# Patient Record
Sex: Female | Born: 1955 | Race: White | Hispanic: No | Marital: Married | Smoking: Never smoker
Health system: Southern US, Community
[De-identification: ages and names within clinical notes are randomized; demographics above are authoritative.]

## PROBLEM LIST (undated history)

## (undated) DIAGNOSIS — F32A Depression, unspecified: Secondary | ICD-10-CM

## (undated) DIAGNOSIS — E119 Type 2 diabetes mellitus without complications: Secondary | ICD-10-CM

## (undated) DIAGNOSIS — M1990 Unspecified osteoarthritis, unspecified site: Secondary | ICD-10-CM

## (undated) DIAGNOSIS — F329 Major depressive disorder, single episode, unspecified: Secondary | ICD-10-CM

## (undated) DIAGNOSIS — M797 Fibromyalgia: Secondary | ICD-10-CM

## (undated) HISTORY — PX: BACK SURGERY: SHX140

## (undated) HISTORY — PX: EYE SURGERY: SHX253

## (undated) HISTORY — PX: TUBAL LIGATION: SHX77

---

## 2014-09-14 ENCOUNTER — Emergency Department (HOSPITAL_COMMUNITY): Payer: PRIVATE HEALTH INSURANCE

## 2014-09-14 ENCOUNTER — Emergency Department (HOSPITAL_COMMUNITY)
Admission: EM | Admit: 2014-09-14 | Discharge: 2014-09-14 | Disposition: A | Discharge status: Home / Self Care | Payer: PRIVATE HEALTH INSURANCE | Attending: Emergency Medicine | Admitting: Emergency Medicine

## 2014-09-14 ENCOUNTER — Encounter (HOSPITAL_COMMUNITY): Payer: Self-pay | Admitting: Emergency Medicine

## 2014-09-14 DIAGNOSIS — S3992XA Unspecified injury of lower back, initial encounter: Secondary | ICD-10-CM | POA: Diagnosis present

## 2014-09-14 DIAGNOSIS — M545 Low back pain, unspecified: Secondary | ICD-10-CM

## 2014-09-14 DIAGNOSIS — Y93E5 Activity, floor mopping and cleaning: Secondary | ICD-10-CM | POA: Insufficient documentation

## 2014-09-14 DIAGNOSIS — Y92019 Unspecified place in single-family (private) house as the place of occurrence of the external cause: Secondary | ICD-10-CM | POA: Insufficient documentation

## 2014-09-14 DIAGNOSIS — E119 Type 2 diabetes mellitus without complications: Secondary | ICD-10-CM | POA: Insufficient documentation

## 2014-09-14 DIAGNOSIS — Y998 Other external cause status: Secondary | ICD-10-CM | POA: Diagnosis not present

## 2014-09-14 DIAGNOSIS — M5431 Sciatica, right side: Secondary | ICD-10-CM

## 2014-09-14 DIAGNOSIS — W11XXXA Fall on and from ladder, initial encounter: Secondary | ICD-10-CM | POA: Diagnosis not present

## 2014-09-14 DIAGNOSIS — Z8739 Personal history of other diseases of the musculoskeletal system and connective tissue: Secondary | ICD-10-CM | POA: Insufficient documentation

## 2014-09-14 HISTORY — DX: Fibromyalgia: M79.7

## 2014-09-14 HISTORY — DX: Type 2 diabetes mellitus without complications: E11.9

## 2014-09-14 HISTORY — DX: Unspecified osteoarthritis, unspecified site: M19.90

## 2014-09-14 MED ORDER — IBUPROFEN 800 MG PO TABS: 800.0000 mg | ORAL_TABLET | Freq: Four times a day (QID) | ORAL | Status: DC | PRN

## 2014-09-14 MED ORDER — OXYCODONE-ACETAMINOPHEN 5-325 MG PO TABS
1.0000 | ORAL_TABLET | Freq: Once | ORAL | Status: AC
  Administered 2014-09-14: 1 via ORAL
  Filled 2014-09-14: qty 1

## 2014-09-14 NOTE — Discharge Instructions (Signed)
Sciatica with Rehab The sciatic nerve runs from the back down the leg and is responsible for sensation and control of the muscles in the back (posterior) side of the thigh, lower leg, and foot. Sciatica is a condition that is characterized by inflammation of this nerve.  SYMPTOMS   Signs of nerve damage, including numbness and/or weakness along the posterior side of the lower extremity.  Pain in the back of the thigh that may also travel down the leg.  Pain that worsens when sitting for long periods of time.  Occasionally, pain in the back or buttock. CAUSES  Inflammation of the sciatic nerve is the cause of sciatica. The inflammation is due to something irritating the nerve. Common sources of irritation include:  Sitting for long periods of time.  Direct trauma to the nerve.  Arthritis of the spine.  Herniated or ruptured disk.  Slipping of the vertebrae (spondylolisthesis).  Pressure from soft tissues, such as muscles or ligament-like tissue (fascia). RISK INCREASES WITH:  Sports that place pressure or stress on the spine (football or weightlifting).  Poor strength and flexibility.  Failure to warm up properly before activity.  Family history of low back pain or disk disorders.  Previous back injury or surgery.  Poor body mechanics, especially when lifting, or poor posture. PREVENTION   Warm up and stretch properly before activity.  Maintain physical fitness:  Strength, flexibility, and endurance.  Cardiovascular fitness.  Learn and use proper technique, especially with posture and lifting. When possible, have coach correct improper technique.  Avoid activities that place stress on the spine. PROGNOSIS If treated properly, then sciatica usually resolves within 6 weeks. However, occasionally surgery is necessary.  RELATED COMPLICATIONS   Permanent nerve damage, including pain, numbness, tingle, or weakness.  Chronic back pain.  Risks of surgery: infection,  bleeding, nerve damage, or damage to surrounding tissues. TREATMENT Treatment initially involves resting from any activities that aggravate your symptoms. The use of ice and medication may help reduce pain and inflammation. The use of strengthening and stretching exercises may help reduce pain with activity. These exercises may be performed at home or with referral to a therapist. A therapist may recommend further treatments, such as transcutaneous electronic nerve stimulation (TENS) or ultrasound. Your caregiver may recommend corticosteroid injections to help reduce inflammation of the sciatic nerve. If symptoms persist despite non-surgical (conservative) treatment, then surgery may be recommended. MEDICATION  If pain medication is necessary, then nonsteroidal anti-inflammatory medications, such as aspirin and ibuprofen, or other minor pain relievers, such as acetaminophen, are often recommended.  Do not take pain medication for 7 days before surgery.  Prescription pain relievers may be given if deemed necessary by your caregiver. Use only as directed and only as much as you need.  Ointments applied to the skin may be helpful.  Corticosteroid injections may be given by your caregiver. These injections should be reserved for the most serious cases, because they may only be given a certain number of times. HEAT AND COLD  Cold treatment (icing) relieves pain and reduces inflammation. Cold treatment should be applied for 10 to 15 minutes every 2 to 3 hours for inflammation and pain and immediately after any activity that aggravates your symptoms. Use ice packs or massage the area with a piece of ice (ice massage).  Heat treatment may be used prior to performing the stretching and strengthening activities prescribed by your caregiver, physical therapist, or athletic trainer. Use a heat pack or soak the injury in warm water.   SEEK MEDICAL CARE IF:  Treatment seems to offer no benefit, or the condition  worsens.  Any medications produce adverse side effects. EXERCISES  RANGE OF MOTION (ROM) AND STRETCHING EXERCISES - Sciatica Most people with sciatic will find that their symptoms worsen with either excessive bending forward (flexion) or arching at the low back (extension). The exercises which will help resolve your symptoms will focus on the opposite motion. Your physician, physical therapist or athletic trainer will help you determine which exercises will be most helpful to resolve your low back pain. Do not complete any exercises without first consulting with your clinician. Discontinue any exercises which worsen your symptoms until you speak to your clinician. If you have pain, numbness or tingling which travels down into your buttocks, leg or foot, the goal of the therapy is for these symptoms to move closer to your back and eventually resolve. Occasionally, these leg symptoms will get better, but your low back pain may worsen; this is typically an indication of progress in your rehabilitation. Be certain to be very alert to any changes in your symptoms and the activities in which you participated in the 24 hours prior to the change. Sharing this information with your clinician will allow him/her to most efficiently treat your condition. These exercises may help you when beginning to rehabilitate your injury. Your symptoms may resolve with or without further involvement from your physician, physical therapist or athletic trainer. While completing these exercises, remember:   Restoring tissue flexibility helps normal motion to return to the joints. This allows healthier, less painful movement and activity.  An effective stretch should be held for at least 30 seconds.  A stretch should never be painful. You should only feel a gentle lengthening or release in the stretched tissue. FLEXION RANGE OF MOTION AND STRETCHING EXERCISES: STRETCH - Flexion, Single Knee to Chest   Lie on a firm bed or floor  with both legs extended in front of you.  Keeping one leg in contact with the floor, bring your opposite knee to your chest. Hold your leg in place by either grabbing behind your thigh or at your knee.  Pull until you feel a gentle stretch in your low back. Hold __________ seconds.  Slowly release your grasp and repeat the exercise with the opposite side. Repeat __________ times. Complete this exercise __________ times per day.  STRETCH - Flexion, Double Knee to Chest  Lie on a firm bed or floor with both legs extended in front of you.  Keeping one leg in contact with the floor, bring your opposite knee to your chest.  Tense your stomach muscles to support your back and then lift your other knee to your chest. Hold your legs in place by either grabbing behind your thighs or at your knees.  Pull both knees toward your chest until you feel a gentle stretch in your low back. Hold __________ seconds.  Tense your stomach muscles and slowly return one leg at a time to the floor. Repeat __________ times. Complete this exercise __________ times per day.  STRETCH - Low Trunk Rotation   Lie on a firm bed or floor. Keeping your legs in front of you, bend your knees so they are both pointed toward the ceiling and your feet are flat on the floor.  Extend your arms out to the side. This will stabilize your upper body by keeping your shoulders in contact with the floor.  Gently and slowly drop both knees together to one side until   you feel a gentle stretch in your low back. Hold for __________ seconds.  Tense your stomach muscles to support your low back as you bring your knees back to the starting position. Repeat the exercise to the other side. Repeat __________ times. Complete this exercise __________ times per day  EXTENSION RANGE OF MOTION AND FLEXIBILITY EXERCISES: STRETCH - Extension, Prone on Elbows  Lie on your stomach on the floor, a bed will be too soft. Place your palms about shoulder  width apart and at the height of your head.  Place your elbows under your shoulders. If this is too painful, stack pillows under your chest.  Allow your body to relax so that your hips drop lower and make contact more completely with the floor.  Hold this position for __________ seconds.  Slowly return to lying flat on the floor. Repeat __________ times. Complete this exercise __________ times per day.  RANGE OF MOTION - Extension, Prone Press Ups  Lie on your stomach on the floor, a bed will be too soft. Place your palms about shoulder width apart and at the height of your head.  Keeping your back as relaxed as possible, slowly straighten your elbows while keeping your hips on the floor. You may adjust the placement of your hands to maximize your comfort. As you gain motion, your hands will come more underneath your shoulders.  Hold this position __________ seconds.  Slowly return to lying flat on the floor. Repeat __________ times. Complete this exercise __________ times per day.  STRENGTHENING EXERCISES - Sciatica  These exercises may help you when beginning to rehabilitate your injury. These exercises should be done near your "sweet spot." This is the neutral, low-back arch, somewhere between fully rounded and fully arched, that is your least painful position. When performed in this safe range of motion, these exercises can be used for people who have either a flexion or extension based injury. These exercises may resolve your symptoms with or without further involvement from your physician, physical therapist or athletic trainer. While completing these exercises, remember:   Muscles can gain both the endurance and the strength needed for everyday activities through controlled exercises.  Complete these exercises as instructed by your physician, physical therapist or athletic trainer. Progress with the resistance and repetition exercises only as your caregiver advises.  You may  experience muscle soreness or fatigue, but the pain or discomfort you are trying to eliminate should never worsen during these exercises. If this pain does worsen, stop and make certain you are following the directions exactly. If the pain is still present after adjustments, discontinue the exercise until you can discuss the trouble with your clinician. STRENGTHENING - Deep Abdominals, Pelvic Tilt   Lie on a firm bed or floor. Keeping your legs in front of you, bend your knees so they are both pointed toward the ceiling and your feet are flat on the floor.  Tense your lower abdominal muscles to press your low back into the floor. This motion will rotate your pelvis so that your tail bone is scooping upwards rather than pointing at your feet or into the floor.  With a gentle tension and even breathing, hold this position for __________ seconds. Repeat __________ times. Complete this exercise __________ times per day.  STRENGTHENING - Abdominals, Crunches   Lie on a firm bed or floor. Keeping your legs in front of you, bend your knees so they are both pointed toward the ceiling and your feet are flat on the   floor. Cross your arms over your chest.  Slightly tip your chin down without bending your neck.  Tense your abdominals and slowly lift your trunk high enough to just clear your shoulder blades. Lifting higher can put excessive stress on the low back and does not further strengthen your abdominal muscles.  Control your return to the starting position. Repeat __________ times. Complete this exercise __________ times per day.  STRENGTHENING - Quadruped, Opposite UE/LE Lift  Assume a hands and knees position on a firm surface. Keep your hands under your shoulders and your knees under your hips. You may place padding under your knees for comfort.  Find your neutral spine and gently tense your abdominal muscles so that you can maintain this position. Your shoulders and hips should form a rectangle  that is parallel with the floor and is not twisted.  Keeping your trunk steady, lift your right hand no higher than your shoulder and then your left leg no higher than your hip. Make sure you are not holding your breath. Hold this position __________ seconds.  Continuing to keep your abdominal muscles tense and your back steady, slowly return to your starting position. Repeat with the opposite arm and leg. Repeat __________ times. Complete this exercise __________ times per day.  STRENGTHENING - Abdominals and Quadriceps, Straight Leg Raise   Lie on a firm bed or floor with both legs extended in front of you.  Keeping one leg in contact with the floor, bend the other knee so that your foot can rest flat on the floor.  Find your neutral spine, and tense your abdominal muscles to maintain your spinal position throughout the exercise.  Slowly lift your straight leg off the floor about 6 inches for a count of 15, making sure to not hold your breath.  Still keeping your neutral spine, slowly lower your leg all the way to the floor. Repeat this exercise with each leg __________ times. Complete this exercise __________ times per day. POSTURE AND BODY MECHANICS CONSIDERATIONS - Sciatica Keeping correct posture when sitting, standing or completing your activities will reduce the stress put on different body tissues, allowing injured tissues a chance to heal and limiting painful experiences. The following are general guidelines for improved posture. Your physician or physical therapist will provide you with any instructions specific to your needs. While reading these guidelines, remember:  The exercises prescribed by your provider will help you have the flexibility and strength to maintain correct postures.  The correct posture provides the optimal environment for your joints to work. All of your joints have less wear and tear when properly supported by a spine with good posture. This means you will  experience a healthier, less painful body.  Correct posture must be practiced with all of your activities, especially prolonged sitting and standing. Correct posture is as important when doing repetitive low-stress activities (typing) as it is when doing a single heavy-load activity (lifting). RESTING POSITIONS Consider which positions are most painful for you when choosing a resting position. If you have pain with flexion-based activities (sitting, bending, stooping, squatting), choose a position that allows you to rest in a less flexed posture. You would want to avoid curling into a fetal position on your side. If your pain worsens with extension-based activities (prolonged standing, working overhead), avoid resting in an extended position such as sleeping on your stomach. Most people will find more comfort when they rest with their spine in a more neutral position, neither too rounded nor too   arched. Lying on a non-sagging bed on your side with a pillow between your knees, or on your back with a pillow under your knees will often provide some relief. Keep in mind, being in any one position for a prolonged period of time, no matter how correct your posture, can still lead to stiffness. PROPER SITTING POSTURE In order to minimize stress and discomfort on your spine, you must sit with correct posture Sitting with good posture should be effortless for a healthy body. Returning to good posture is a gradual process. Many people can work toward this most comfortably by using various supports until they have the flexibility and strength to maintain this posture on their own. When sitting with proper posture, your ears will fall over your shoulders and your shoulders will fall over your hips. You should use the back of the chair to support your upper back. Your low back will be in a neutral position, just slightly arched. You may place a small pillow or folded towel at the base of your low back for support.  When  working at a desk, create an environment that supports good, upright posture. Without extra support, muscles fatigue and lead to excessive strain on joints and other tissues. Keep these recommendations in mind: CHAIR:   A chair should be able to slide under your desk when your back makes contact with the back of the chair. This allows you to work closely.  The chair's height should allow your eyes to be level with the upper part of your monitor and your hands to be slightly lower than your elbows. BODY POSITION  Your feet should make contact with the floor. If this is not possible, use a foot rest.  Keep your ears over your shoulders. This will reduce stress on your neck and low back. INCORRECT SITTING POSTURES   If you are feeling tired and unable to assume a healthy sitting posture, do not slouch or slump. This puts excessive strain on your back tissues, causing more damage and pain. Healthier options include:  Using more support, like a lumbar pillow.  Switching tasks to something that requires you to be upright or walking.  Talking a brief walk.  Lying down to rest in a neutral-spine position. PROLONGED STANDING WHILE SLIGHTLY LEANING FORWARD  When completing a task that requires you to lean forward while standing in one place for a long time, place either foot up on a stationary 2-4 inch high object to help maintain the best posture. When both feet are on the ground, the low back tends to lose its slight inward curve. If this curve flattens (or becomes too large), then the back and your other joints will experience too much stress, fatigue more quickly and can cause pain.  CORRECT STANDING POSTURES Proper standing posture should be assumed with all daily activities, even if they only take a few moments, like when brushing your teeth. As in sitting, your ears should fall over your shoulders and your shoulders should fall over your hips. You should keep a slight tension in your abdominal  muscles to brace your spine. Your tailbone should point down to the ground, not behind your body, resulting in an over-extended swayback posture.  INCORRECT STANDING POSTURES  Common incorrect standing postures include a forward head, locked knees and/or an excessive swayback. WALKING Walk with an upright posture. Your ears, shoulders and hips should all line-up. PROLONGED ACTIVITY IN A FLEXED POSITION When completing a task that requires you to bend forward   at your waist or lean over a low surface, try to find a way to stabilize 3 of 4 of your limbs. You can place a hand or elbow on your thigh or rest a knee on the surface you are reaching across. This will provide you more stability so that your muscles do not fatigue as quickly. By keeping your knees relaxed, or slightly bent, you will also reduce stress across your low back. CORRECT LIFTING TECHNIQUES DO :   Assume a wide stance. This will provide you more stability and the opportunity to get as close as possible to the object which you are lifting.  Tense your abdominals to brace your spine; then bend at the knees and hips. Keeping your back locked in a neutral-spine position, lift using your leg muscles. Lift with your legs, keeping your back straight.  Test the weight of unknown objects before attempting to lift them.  Try to keep your elbows locked down at your sides in order get the best strength from your shoulders when carrying an object.  Always ask for help when lifting heavy or awkward objects. INCORRECT LIFTING TECHNIQUES DO NOT:   Lock your knees when lifting, even if it is a small object.  Bend and twist. Pivot at your feet or move your feet when needing to change directions.  Assume that you cannot safely pick up a paperclip without proper posture. Document Released: 09/02/2005 Document Revised: 01/17/2014 Document Reviewed: 12/15/2008 [employer] Patient Information 2015 [employer]. This information is not intended to  replace advice given to you by your health care provider. Make sure you discuss any questions you have with your health care provider.  

## 2014-09-14 NOTE — ED Notes (Signed)
Patient states chronic back pain and [facility]   Patient states has been going to PT for 2-3 months.   Patient has an appointment on Monday, but states she could not wait.

## 2014-09-14 NOTE — ED Provider Notes (Signed)
CSN: [id number]     Arrival date & time 09/14/14  0900 History  This chart was scribed for Abigail HelperBowie Areesha Dehaven, PA-C, working with Suzi RootsKevin E Steinl, MD by Chestine SporeSoijett Blue, ED Scribe. The patient was seen in room TR09C/TR09C at 9:23 AM.    Chief Complaint  Patient presents with  . Back Pain     The history is provided by the patient. No language interpreter was used.    HPI Comments: Abigail Lawrence is a 58 y.o. female who presents to the Emergency Department complaining of burning intermittent back pain onset 1 year. The pain radiates down her right side to her leg. She has had back issues for more than a year and she follows up with [facility]. Began with a chiropractor, then Physical Therapy evaluation where it was determined that she didn't have hip pain. It was determined that she had back pain that was causing deferred pain to her hip. She was informed by the other specialists that she has arthritis in both hips. While at work on Monday night she was climbing on a stool, a method that is not new to her. The pain began to worsen once on the stool. She has been on the sofa ever since Monday because of the pain. She informed [facility] to get an appointment but they were busy. The specialist informed her that she would not need surgical intervention. She was given tramadol for her pain which she has a Community education officercontract for.   She reports that she fell three weeks ago while cleaning windows at home. Her foot tipped the ladder and then she slide down the wall and she had bruises on her breast and down the front of her body. Denies having a X-ray of her lower back. She states that she is having associated symptoms of tingling primarily to her RLE. She states that she has tried 800 mg Motrin and 25 mg Tramadol with no relief for her symptoms. She denies fever, chills, loss of bladder/bowel function and any other symptoms. No dysuria or hematuria.  Past Medical History  Diagnosis Date  . Arthritis   .  Diabetes mellitus without complication   . Fibromyalgia    Past Surgical History  Procedure Laterality Date  . Eye surgery     No family history on file. History  Substance Use Topics  . Smoking status: Not on file  . Smokeless tobacco: Not on file  . Alcohol Use: No   OB History    No data available     Review of Systems  Constitutional: Negative for fever and chills.  Gastrointestinal:       No loss of bowel function  Genitourinary: Negative for dysuria and hematuria.       No loss of bladder function  Musculoskeletal: Positive for myalgias, back pain and arthralgias.  Neurological:       Tingling      Allergies  Sulfa antibiotics  Home Medications   Prior to Admission medications   Not on File   BP 170/77 mmHg  Pulse 100  Temp(Src) 98.4 F (36.9 C) (Oral)  Resp 18  SpO2 100%  Physical Exam  Constitutional: She is oriented to person, place, and time. She appears well-developed and well-nourished. No distress.  HENT:  Head: Normocephalic and atraumatic.  Eyes: EOM are normal.  Neck: Neck supple. No tracheal deviation present.  Cardiovascular: Normal rate.   Pulmonary/Chest: Effort normal. No respiratory distress.  Musculoskeletal: Normal range of motion.  No significant midline spinal  tenderness. Tenderness noted to left lumbar sacral region. Pt ambulates without difficulty. (-)SLR  Neurological: She is alert and oriented to person, place, and time.  Skin: Skin is warm and dry.  Psychiatric: She has a normal mood and affect. Her behavior is normal.  Nursing note and vitals reviewed.   ED Course  Procedures (including critical care time) DIAGNOSTIC STUDIES: Oxygen Saturation is 100% on room air, normal by my interpretation.    COORDINATION OF CARE: 9:39 AM-discussed with pt that pain is likely sciatica as there are radicular component to it.  she is NVI.  She is able to ambulate.  Pt does request for xray because they worry of possible cancer.  pt  made aware that a negative xray does not exclude cancer.  Discussed treatment plan which includes L-spine X-ray, Percocet with pt at bedside and pt agreed to plan.   10:40 AM Pt able ambulate.  She is made aware of the xray finding.  Since she currently having a pain contract and also have a close f/u appointment with her orthopedic doctor i recommend taking ibuprofen in the mean time.  She is a diabetic and her doctor will coordinate her care to accommodate steroid treatment if appropriate.  Return precaution discussed.  Labs Review Labs Reviewed - No data to display  Imaging Review Dg Lumbar Spine Complete  09/14/2014   CLINICAL DATA:  Chronic low back pain with right-sided radicular symptoms  EXAM: LUMBAR SPINE - COMPLETE 4+ VIEW  COMPARISON:  None.  FINDINGS: Frontal, lateral, spot lumbosacral lateral, and bilateral oblique views were obtained. There are 5 non-rib-bearing lumbar type vertebral bodies. There is levoscoliosis with rotatory component. There is no demonstrable fracture. There is 14 mm of anterolisthesis of L4 on L5. No other spondylolisthesis is appreciable. There is marked disc space narrowing at L4-5. Other disc spaces appear unremarkable. There are apparent pars defects at L5 bilaterally. There is facet osteoarthritic change at L4-5 and L5-S1 bilaterally.  IMPRESSION: 14 mm anterolisthesis of L4 on L5, likely due to pars defects. Marked disc space narrowing at L4-5. No fracture. There is scoliosis.   Electronically Signed   By: Bretta BangWilliam  Woodruff M.D.   On: 09/14/2014 10:23     EKG Interpretation None      MDM   Final diagnoses:  Low back pain  Sciatica, right    BP 170/77 mmHg  Pulse 100  Temp(Src) 98.4 F (36.9 C) (Oral)  Resp 18  SpO2 100%  I have reviewed nursing notes and vital signs. I personally reviewed the imaging tests through PACS system  I reviewed available ER/hospitalization records thought the EMR     I personally performed the services  described in this documentation, which was scribed in my presence. The recorded information has been reviewed and is accurate.    Abigail HelperBowie Aubrey Voong, PA-C 09/14/14 [address]1039  Lui Bellis, PA-C 09/14/14 1042  Suzi RootsKevin E Steinl, MD 09/14/14 [id number]

## 2014-10-21 NOTE — Pre-Procedure Instructions (Signed)
Abigail Lawrence  10/21/2014   Your procedure is scheduled on:  Wednesday, February 10.  [facility]mitting at 9:00 AM.  Call this number if you have problems the morning of surgery: [phone]   Remember:   Do not eat food or drink liquids after midnight Tuesday, February 9.   Take these medicines the morning of surgery with A SIP OF WATER: escitalopram (LEXAPRO), gabapentin (NEURONTIN).              Take if needed:traMADol [name](ULTRAM).               Stop taking ibuprofen (ADVIL,MOTRIN. )  Do not wear jewelry, make-up or nail polish.  Do not wear lotions, powders, or perfumes.   Do not shave 48 hours prior to surgery.  Do not bring valuables to the hospital.             [facility] is not responsible for any belongings or valuables.               Contacts, dentures or bridgework may not be worn into surgery.  Leave suitcase in the car. After surgery it may be brought to your room.  For patients admitted to the hospital, discharge time is determined by your treatment team.                 Special Instructions: Review  [facility] - Preparing For Surgery.   Please read over the following fact sheets that you were given: Pain Booklet, Coughing and Deep Breathing, Blood Transfusion Information and Surgical Site Infection Prevention

## 2014-10-24 ENCOUNTER — Encounter (HOSPITAL_COMMUNITY)
Admission: RE | Admit: 2014-10-24 | Discharge: 2014-10-24 | Disposition: A | Discharge status: Home / Self Care | Payer: PRIVATE HEALTH INSURANCE | Source: Ambulatory Visit | Attending: Orthopedic Surgery | Admitting: Orthopedic Surgery

## 2014-10-24 ENCOUNTER — Encounter (HOSPITAL_COMMUNITY): Payer: Self-pay

## 2014-10-24 HISTORY — DX: Major depressive disorder, single episode, unspecified: F32.9

## 2014-10-24 HISTORY — DX: Depression, unspecified: F32.A

## 2014-10-24 LAB — CBC
HCT: 35.2 % — ABNORMAL LOW (ref 36.0–46.0)
HEMOGLOBIN: 11.5 g/dL — AB (ref 12.0–15.0)
MCH: 29.9 pg (ref 26.0–34.0)
MCHC: 32.7 g/dL (ref 30.0–36.0)
MCV: 91.4 fL (ref 78.0–100.0)
Platelets: 275 10*3/uL (ref 150–400)
RBC: 3.85 MIL/uL — ABNORMAL LOW (ref 3.87–5.11)
RDW: 13.9 % (ref 11.5–15.5)
WBC: 3.2 10*3/uL — ABNORMAL LOW (ref 4.0–10.5)

## 2014-10-24 LAB — TYPE AND SCREEN
ABO/RH(D): A POS
Antibody Screen: NEGATIVE

## 2014-10-24 LAB — BASIC METABOLIC PANEL
ANION GAP: 7 (ref 5–15)
BUN: 13 mg/dL (ref 6–23)
CHLORIDE: 104 mmol/L (ref 96–112)
CO2: 29 mmol/L (ref 19–32)
CREATININE: 0.57 mg/dL (ref 0.50–1.10)
Calcium: 9.4 mg/dL (ref 8.4–10.5)
GFR calc non Af Amer: >90 mL/min (ref >90)
Glucose, Bld: 121 mg/dL — ABNORMAL HIGH (ref 70–99)
POTASSIUM: 4 mmol/L (ref 3.5–5.1)
Sodium: 140 mmol/L (ref 135–145)

## 2014-10-24 LAB — ABO/RH: ABO/RH(D): A POS

## 2014-10-24 LAB — SURGICAL PCR SCREEN
MRSA, PCR: NEGATIVE
Staphylococcus aureus: NEGATIVE

## 2014-10-25 MED ORDER — DEXAMETHASONE SODIUM PHOSPHATE 4 MG/ML IJ SOLN
10.0000 mg | Freq: Once | INTRAMUSCULAR | Status: AC
  Administered 2014-10-26: 10 mg via INTRAVENOUS
  Filled 2014-10-25: qty 3

## 2014-10-25 MED ORDER — CEFAZOLIN SODIUM-DEXTROSE 2-3 GM-% IV SOLR
2.0000 g | INTRAVENOUS | Status: AC
  Administered 2014-10-26: 2 g via INTRAVENOUS
  Filled 2014-10-25: qty 50

## 2014-10-26 ENCOUNTER — Encounter (HOSPITAL_COMMUNITY)
Admission: RE | Disposition: A | Discharge status: Home / Self Care | Payer: Self-pay | Source: Ambulatory Visit | Attending: Orthopedic Surgery

## 2014-10-26 ENCOUNTER — Inpatient Hospital Stay (HOSPITAL_COMMUNITY)
Admission: RE | Admit: 2014-10-26 | Discharge: 2014-10-29 | DRG: 460 | Disposition: A | Discharge status: Home / Self Care | Payer: PRIVATE HEALTH INSURANCE | Source: Ambulatory Visit | Attending: Orthopedic Surgery | Admitting: Orthopedic Surgery

## 2014-10-26 ENCOUNTER — Inpatient Hospital Stay (HOSPITAL_COMMUNITY): Payer: PRIVATE HEALTH INSURANCE | Admitting: Certified Registered Nurse Anesthetist

## 2014-10-26 ENCOUNTER — Encounter (HOSPITAL_COMMUNITY): Payer: Self-pay | Admitting: Surgery

## 2014-10-26 ENCOUNTER — Inpatient Hospital Stay (HOSPITAL_COMMUNITY): Payer: PRIVATE HEALTH INSURANCE

## 2014-10-26 DIAGNOSIS — E119 Type 2 diabetes mellitus without complications: Secondary | ICD-10-CM | POA: Diagnosis present

## 2014-10-26 DIAGNOSIS — Z01810 Encounter for preprocedural cardiovascular examination: Secondary | ICD-10-CM

## 2014-10-26 DIAGNOSIS — M4316 Spondylolisthesis, lumbar region: Principal | ICD-10-CM | POA: Diagnosis present

## 2014-10-26 DIAGNOSIS — M797 Fibromyalgia: Secondary | ICD-10-CM | POA: Diagnosis present

## 2014-10-26 DIAGNOSIS — Z01812 Encounter for preprocedural laboratory examination: Secondary | ICD-10-CM

## 2014-10-26 DIAGNOSIS — M4806 Spinal stenosis, lumbar region: Secondary | ICD-10-CM | POA: Diagnosis present

## 2014-10-26 DIAGNOSIS — M549 Dorsalgia, unspecified: Secondary | ICD-10-CM | POA: Diagnosis present

## 2014-10-26 DIAGNOSIS — F329 Major depressive disorder, single episode, unspecified: Secondary | ICD-10-CM | POA: Diagnosis present

## 2014-10-26 DIAGNOSIS — Z981 Arthrodesis status: Secondary | ICD-10-CM

## 2014-10-26 DIAGNOSIS — M1990 Unspecified osteoarthritis, unspecified site: Secondary | ICD-10-CM | POA: Diagnosis present

## 2014-10-26 DIAGNOSIS — Z419 Encounter for procedure for purposes other than remedying health state, unspecified: Secondary | ICD-10-CM

## 2014-10-26 LAB — POCT I-STAT 4, (NA,K, GLUC, HGB,HCT)
Glucose, Bld: 314 mg/dL — ABNORMAL HIGH (ref 70–99)
HEMATOCRIT: 26 % — AB (ref 36.0–46.0)
HEMOGLOBIN: 8.8 g/dL — AB (ref 12.0–15.0)
POTASSIUM: 3.9 mmol/L (ref 3.5–5.1)
Sodium: 137 mmol/L (ref 135–145)

## 2014-10-26 LAB — GLUCOSE, CAPILLARY
GLUCOSE-CAPILLARY: 123 mg/dL — AB (ref 70–99)
GLUCOSE-CAPILLARY: 287 mg/dL — AB (ref 70–99)
GLUCOSE-CAPILLARY: 196 mg/dL — AB (ref 70–99)
Glucose-Capillary: 281 mg/dL — ABNORMAL HIGH (ref 70–99)
Glucose-Capillary: 229 mg/dL — ABNORMAL HIGH (ref 70–99)

## 2014-10-26 LAB — HEMOGLOBIN AND HEMATOCRIT, BLOOD
HEMATOCRIT: 26.1 % — AB (ref 36.0–46.0)
HEMOGLOBIN: 8.7 g/dL — AB (ref 12.0–15.0)

## 2014-10-26 SURGERY — POSTERIOR LUMBAR FUSION 1 LEVEL
Anesthesia: General | Site: Back

## 2014-10-26 MED ORDER — DOCUSATE SODIUM 100 MG PO CAPS
100.0000 mg | ORAL_CAPSULE | Freq: Two times a day (BID) | ORAL | Status: DC
  Administered 2014-10-26 – 2014-10-29 (×6): 100 mg via ORAL
  Filled 2014-10-26 (×8): qty 1

## 2014-10-26 MED ORDER — SODIUM CHLORIDE 0.9 % IV SOLN
Freq: Once | INTRAVENOUS | Status: AC
  Administered 2014-10-26: 250 mL via INTRAVENOUS

## 2014-10-26 MED ORDER — DEXAMETHASONE SODIUM PHOSPHATE 4 MG/ML IJ SOLN
4.0000 mg | Freq: Four times a day (QID) | INTRAMUSCULAR | Status: DC
  Administered 2014-10-29 (×2): 4 mg via INTRAVENOUS
  Filled 2014-10-26 (×12): qty 1

## 2014-10-26 MED ORDER — OXYCODONE-ACETAMINOPHEN 10-325 MG PO TABS: 1.0000 | ORAL_TABLET | ORAL | Status: DC | PRN

## 2014-10-26 MED ORDER — PROPOFOL 10 MG/ML IV BOLUS
INTRAVENOUS | Status: DC | PRN
  Administered 2014-10-26: 30 mg via INTRAVENOUS
  Administered 2014-10-26: 110 mg via INTRAVENOUS

## 2014-10-26 MED ORDER — ESCITALOPRAM OXALATE 10 MG PO TABS
10.0000 mg | ORAL_TABLET | Freq: Every day | ORAL | Status: DC
  Administered 2014-10-27 – 2014-10-29 (×3): 10 mg via ORAL
  Filled 2014-10-26 (×4): qty 1

## 2014-10-26 MED ORDER — DOCUSATE SODIUM 100 MG PO CAPS: 100.0000 mg | ORAL_CAPSULE | Freq: Three times a day (TID) | ORAL | Status: AC | PRN

## 2014-10-26 MED ORDER — SODIUM CHLORIDE 0.9 % IJ SOLN: 3.0000 mL | INTRAMUSCULAR | Status: DC | PRN

## 2014-10-26 MED ORDER — ONDANSETRON HCL 4 MG/2ML IJ SOLN: 4.0000 mg | INTRAMUSCULAR | Status: DC | PRN

## 2014-10-26 MED ORDER — INSULIN ASPART 100 UNIT/ML ~~LOC~~ SOLN
SUBCUTANEOUS | Status: AC
  Filled 2014-10-26: qty 8

## 2014-10-26 MED ORDER — PHENYLEPHRINE HCL 10 MG/ML IJ SOLN
10.0000 mg | INTRAVENOUS | Status: DC | PRN
  Administered 2014-10-26: 5 ug/min via INTRAVENOUS

## 2014-10-26 MED ORDER — SODIUM CHLORIDE 0.9 % IV SOLN: 250.0000 mL | INTRAVENOUS | Status: DC

## 2014-10-26 MED ORDER — FENTANYL CITRATE 0.05 MG/ML IJ SOLN
INTRAMUSCULAR | Status: AC
  Filled 2014-10-26: qty 5

## 2014-10-26 MED ORDER — BUPIVACAINE-EPINEPHRINE (PF) 0.25% -1:200000 IJ SOLN
INTRAMUSCULAR | Status: AC
  Filled 2014-10-26: qty 30

## 2014-10-26 MED ORDER — ONDANSETRON HCL 4 MG/2ML IJ SOLN
INTRAMUSCULAR | Status: DC | PRN
  Administered 2014-10-26: 4 mg via INTRAVENOUS

## 2014-10-26 MED ORDER — THROMBIN 20000 UNITS EX SOLR
CUTANEOUS | Status: AC
  Filled 2014-10-26: qty 20000

## 2014-10-26 MED ORDER — ONDANSETRON HCL 4 MG/2ML IJ SOLN
INTRAMUSCULAR | Status: AC
  Filled 2014-10-26: qty 4

## 2014-10-26 MED ORDER — MIDAZOLAM HCL 2 MG/2ML IJ SOLN
INTRAMUSCULAR | Status: AC
  Filled 2014-10-26: qty 2

## 2014-10-26 MED ORDER — ONDANSETRON HCL 4 MG/2ML IJ SOLN
4.0000 mg | Freq: Once | INTRAMUSCULAR | Status: DC | PRN
Start: 2014-10-26 — End: 2014-10-26

## 2014-10-26 MED ORDER — GABAPENTIN 100 MG PO CAPS
100.0000 mg | ORAL_CAPSULE | Freq: Three times a day (TID) | ORAL | Status: DC
  Administered 2014-10-26 – 2014-10-29 (×9): 100 mg via ORAL
  Filled 2014-10-26 (×13): qty 1

## 2014-10-26 MED ORDER — HYDROMORPHONE HCL 1 MG/ML IJ SOLN
INTRAMUSCULAR | Status: AC
  Filled 2014-10-26: qty 1

## 2014-10-26 MED ORDER — INSULIN DETEMIR 100 UNIT/ML ~~LOC~~ SOLN
10.0000 [IU] | Freq: Two times a day (BID) | SUBCUTANEOUS | Status: DC
  Administered 2014-10-26 – 2014-10-27 (×2): 10 [IU] via SUBCUTANEOUS
  Filled 2014-10-26 (×3): qty 0.1

## 2014-10-26 MED ORDER — LIDOCAINE HCL (CARDIAC) 20 MG/ML IV SOLN
INTRAVENOUS | Status: DC | PRN
  Administered 2014-10-26: 100 mg via INTRAVENOUS

## 2014-10-26 MED ORDER — BUPIVACAINE-EPINEPHRINE 0.25% -1:200000 IJ SOLN
INTRAMUSCULAR | Status: DC | PRN
  Administered 2014-10-26: 10 mL

## 2014-10-26 MED ORDER — MEPERIDINE HCL 25 MG/ML IJ SOLN: 6.2500 mg | INTRAMUSCULAR | Status: DC | PRN

## 2014-10-26 MED ORDER — INSULIN ASPART 100 UNIT/ML ~~LOC~~ SOLN
0.0000 [IU] | SUBCUTANEOUS | Status: DC
  Administered 2014-10-26 (×2): 8 [IU] via SUBCUTANEOUS
  Administered 2014-10-26: 3 [IU] via SUBCUTANEOUS
  Administered 2014-10-27 (×2): 5 [IU] via SUBCUTANEOUS
  Administered 2014-10-27: 8 [IU] via SUBCUTANEOUS

## 2014-10-26 MED ORDER — DEXAMETHASONE 4 MG PO TABS
4.0000 mg | ORAL_TABLET | Freq: Four times a day (QID) | ORAL | Status: DC
  Administered 2014-10-26 – 2014-10-28 (×9): 4 mg via ORAL
  Filled 2014-10-26 (×16): qty 1

## 2014-10-26 MED ORDER — LACTATED RINGERS IV SOLN
INTRAVENOUS | Status: DC
  Administered 2014-10-26: 18:00:00 via INTRAVENOUS

## 2014-10-26 MED ORDER — DEXAMETHASONE SODIUM PHOSPHATE 4 MG/ML IJ SOLN
INTRAMUSCULAR | Status: AC
  Filled 2014-10-26: qty 1

## 2014-10-26 MED ORDER — INSULIN LISPRO 100 UNIT/ML ~~LOC~~ SOLN: 0.0000 [IU] | Freq: Three times a day (TID) | SUBCUTANEOUS | Status: DC

## 2014-10-26 MED ORDER — FLEET ENEMA 7-19 GM/118ML RE ENEM: 1.0000 | ENEMA | Freq: Once | RECTAL | Status: AC | PRN

## 2014-10-26 MED ORDER — SUCCINYLCHOLINE CHLORIDE 20 MG/ML IJ SOLN
INTRAMUSCULAR | Status: AC
  Filled 2014-10-26: qty 1

## 2014-10-26 MED ORDER — 0.9 % SODIUM CHLORIDE (POUR BTL) OPTIME
TOPICAL | Status: DC | PRN
  Administered 2014-10-26: 1000 mL

## 2014-10-26 MED ORDER — FENTANYL CITRATE 0.05 MG/ML IJ SOLN
INTRAMUSCULAR | Status: DC | PRN
  Administered 2014-10-26: 50 ug via INTRAVENOUS
  Administered 2014-10-26: 25 ug via INTRAVENOUS
  Administered 2014-10-26: 100 ug via INTRAVENOUS
  Administered 2014-10-26: 75 ug via INTRAVENOUS

## 2014-10-26 MED ORDER — GLYCOPYRROLATE 0.2 MG/ML IJ SOLN
INTRAMUSCULAR | Status: DC | PRN
Start: 2014-10-26 — End: 2014-10-26
  Administered 2014-10-26: 0.6 mg via INTRAVENOUS

## 2014-10-26 MED ORDER — NEOSTIGMINE METHYLSULFATE 10 MG/10ML IV SOLN
INTRAVENOUS | Status: DC | PRN
  Administered 2014-10-26: 3 mg via INTRAVENOUS

## 2014-10-26 MED ORDER — HYDROMORPHONE HCL 1 MG/ML IJ SOLN
0.5000 mg | INTRAMUSCULAR | Status: DC | PRN
  Administered 2014-10-26 – 2014-10-27 (×2): 1 mg via INTRAVENOUS
  Filled 2014-10-26 (×3): qty 1

## 2014-10-26 MED ORDER — ACETAMINOPHEN 10 MG/ML IV SOLN
INTRAVENOUS | Status: AC
  Filled 2014-10-26: qty 100

## 2014-10-26 MED ORDER — LISINOPRIL 10 MG PO TABS
10.0000 mg | ORAL_TABLET | Freq: Every day | ORAL | Status: DC
  Administered 2014-10-27 – 2014-10-28 (×2): 10 mg via ORAL
  Filled 2014-10-26 (×4): qty 1

## 2014-10-26 MED ORDER — NEOSTIGMINE METHYLSULFATE 10 MG/10ML IV SOLN
INTRAVENOUS | Status: AC
  Filled 2014-10-26: qty 2

## 2014-10-26 MED ORDER — THROMBIN 20000 UNITS EX SOLR
CUTANEOUS | Status: DC | PRN
  Administered 2014-10-26: 12:00:00 via TOPICAL

## 2014-10-26 MED ORDER — GLYCOPYRROLATE 0.2 MG/ML IJ SOLN
INTRAMUSCULAR | Status: AC
  Filled 2014-10-26: qty 5

## 2014-10-26 MED ORDER — ROCURONIUM BROMIDE 50 MG/5ML IV SOLN
INTRAVENOUS | Status: AC
  Filled 2014-10-26: qty 2

## 2014-10-26 MED ORDER — HEMOSTATIC AGENTS (NO CHARGE) OPTIME
TOPICAL | Status: DC | PRN
  Administered 2014-10-26: 1 via TOPICAL

## 2014-10-26 MED ORDER — PHENOL 1.4 % MT LIQD: 1.0000 | OROMUCOSAL | Status: DC | PRN

## 2014-10-26 MED ORDER — ROCURONIUM BROMIDE 50 MG/5ML IV SOLN
INTRAVENOUS | Status: AC
  Filled 2014-10-26: qty 1

## 2014-10-26 MED ORDER — ALBUMIN HUMAN 5 % IV SOLN
INTRAVENOUS | Status: DC | PRN
  Administered 2014-10-26 (×2): via INTRAVENOUS

## 2014-10-26 MED ORDER — ROCURONIUM BROMIDE 100 MG/10ML IV SOLN
INTRAVENOUS | Status: DC | PRN
  Administered 2014-10-26: 10 mg via INTRAVENOUS
  Administered 2014-10-26: 50 mg via INTRAVENOUS
  Administered 2014-10-26: 5 mg via INTRAVENOUS

## 2014-10-26 MED ORDER — LIDOCAINE HCL (CARDIAC) 20 MG/ML IV SOLN
INTRAVENOUS | Status: AC
  Filled 2014-10-26: qty 10

## 2014-10-26 MED ORDER — ACETAMINOPHEN 10 MG/ML IV SOLN
1000.0000 mg | Freq: Four times a day (QID) | INTRAVENOUS | Status: DC
  Administered 2014-10-26: 1000 mg via INTRAVENOUS

## 2014-10-26 MED ORDER — SODIUM CHLORIDE 0.9 % IJ SOLN
3.0000 mL | Freq: Two times a day (BID) | INTRAMUSCULAR | Status: DC
  Administered 2014-10-26 – 2014-10-28 (×3): 3 mL via INTRAVENOUS

## 2014-10-26 MED ORDER — HYDROMORPHONE HCL 1 MG/ML IJ SOLN: 0.2500 mg | INTRAMUSCULAR | Status: DC | PRN

## 2014-10-26 MED ORDER — PHENYLEPHRINE 40 MCG/ML (10ML) SYRINGE FOR IV PUSH (FOR BLOOD PRESSURE SUPPORT)
PREFILLED_SYRINGE | INTRAVENOUS | Status: AC
  Filled 2014-10-26: qty 20

## 2014-10-26 MED ORDER — PHENYLEPHRINE HCL 10 MG/ML IJ SOLN
INTRAMUSCULAR | Status: DC | PRN
  Administered 2014-10-26: 80 ug via INTRAVENOUS
  Administered 2014-10-26 (×4): 40 ug via INTRAVENOUS
  Administered 2014-10-26 (×2): 80 ug via INTRAVENOUS
  Administered 2014-10-26: 40 ug via INTRAVENOUS
  Administered 2014-10-26: 80 ug via INTRAVENOUS
  Administered 2014-10-26 (×2): 40 ug via INTRAVENOUS

## 2014-10-26 MED ORDER — ACETAMINOPHEN 10 MG/ML IV SOLN
1000.0000 mg | Freq: Four times a day (QID) | INTRAVENOUS | Status: DC
  Administered 2014-10-26 – 2014-10-27 (×3): 1000 mg via INTRAVENOUS
  Filled 2014-10-26 (×4): qty 100

## 2014-10-26 MED ORDER — OXYCODONE HCL 5 MG PO TABS
10.0000 mg | ORAL_TABLET | ORAL | Status: DC | PRN
  Administered 2014-10-26 – 2014-10-29 (×13): 10 mg via ORAL
  Filled 2014-10-26 (×14): qty 2

## 2014-10-26 MED ORDER — METHOCARBAMOL 1000 MG/10ML IJ SOLN
500.0000 mg | Freq: Four times a day (QID) | INTRAVENOUS | Status: DC | PRN
  Filled 2014-10-26: qty 5

## 2014-10-26 MED ORDER — METHOCARBAMOL 500 MG PO TABS
500.0000 mg | ORAL_TABLET | Freq: Four times a day (QID) | ORAL | Status: DC | PRN
  Administered 2014-10-27 – 2014-10-28 (×5): 500 mg via ORAL
  Filled 2014-10-26 (×6): qty 1

## 2014-10-26 MED ORDER — NEOSTIGMINE METHYLSULFATE 10 MG/10ML IV SOLN
INTRAVENOUS | Status: AC
  Filled 2014-10-26: qty 1

## 2014-10-26 MED ORDER — MIDAZOLAM HCL 5 MG/5ML IJ SOLN
INTRAMUSCULAR | Status: DC | PRN
  Administered 2014-10-26: 2 mg via INTRAVENOUS

## 2014-10-26 MED ORDER — MENTHOL 3 MG MT LOZG: 1.0000 | LOZENGE | OROMUCOSAL | Status: DC | PRN

## 2014-10-26 MED ORDER — HYDROMORPHONE HCL 1 MG/ML IJ SOLN
0.2500 mg | INTRAMUSCULAR | Status: DC | PRN
  Administered 2014-10-26 (×4): 0.25 mg via INTRAVENOUS

## 2014-10-26 MED ORDER — METHOCARBAMOL 500 MG PO TABS: 500.0000 mg | ORAL_TABLET | Freq: Three times a day (TID) | ORAL | Status: DC | PRN

## 2014-10-26 MED ORDER — LACTATED RINGERS IV SOLN
INTRAVENOUS | Status: DC
  Administered 2014-10-26 (×3): via INTRAVENOUS
  Administered 2014-10-27: 10 mL/h via INTRAVENOUS

## 2014-10-26 MED ORDER — INSULIN ASPART 100 UNIT/ML IV SOLN
5.0000 [IU] | Freq: Once | INTRAVENOUS | Status: AC
  Administered 2014-10-26: 5 [IU] via INTRAVENOUS
  Filled 2014-10-26: qty 0.05

## 2014-10-26 MED ORDER — ONDANSETRON HCL 4 MG PO TABS: 4.0000 mg | ORAL_TABLET | Freq: Three times a day (TID) | ORAL | Status: DC | PRN

## 2014-10-26 MED ORDER — CEFAZOLIN SODIUM 1-5 GM-% IV SOLN
1.0000 g | Freq: Three times a day (TID) | INTRAVENOUS | Status: AC
  Administered 2014-10-26 – 2014-10-27 (×2): 1 g via INTRAVENOUS
  Filled 2014-10-26 (×2): qty 50

## 2014-10-26 SURGICAL SUPPLY — 63 items
BLADE SURG ROTATE 9660 (MISCELLANEOUS) ×3 IMPLANT
BUR EGG ELITE 4.0 (BURR) IMPLANT
BUR EGG ELITE 4.0MM (BURR)
CLOSURE STERI-STRIP 1/2X4 (GAUZE/BANDAGES/DRESSINGS) ×1
CLSR STERI-STRIP ANTIMIC 1/2X4 (GAUZE/BANDAGES/DRESSINGS) ×2 IMPLANT
COVER MAYO STAND STRL (DRAPES) ×6 IMPLANT
COVER SURGICAL LIGHT HANDLE (MISCELLANEOUS) ×3 IMPLANT
DRAPE C-ARM 42X72 X-RAY (DRAPES) ×3 IMPLANT
DRAPE C-ARMOR (DRAPES) ×3 IMPLANT
DRAPE ORTHO SPLIT 77X108 STRL (DRAPES) ×2
DRAPE POUCH INSTRU U-SHP 10X18 (DRAPES) ×3 IMPLANT
DRAPE SURG 17X23 STRL (DRAPES) ×3 IMPLANT
DRAPE SURG ORHT 6 SPLT 77X108 (DRAPES) ×1 IMPLANT
DRAPE U-SHAPE 47X51 STRL (DRAPES) ×3 IMPLANT
DRSG MEPILEX BORDER 4X8 (GAUZE/BANDAGES/DRESSINGS) ×3 IMPLANT
DURAPREP 26ML APPLICATOR (WOUND CARE) ×3 IMPLANT
ELECT BLADE 4.0 EZ CLEAN MEGAD (MISCELLANEOUS) ×3
ELECT BLADE 6.5 EXT (BLADE) ×3 IMPLANT
ELECT PENCIL ROCKER SW 15FT (MISCELLANEOUS) ×3 IMPLANT
ELECT REM PT RETURN 9FT ADLT (ELECTROSURGICAL) ×3
ELECTRODE BLDE 4.0 EZ CLN MEGD (MISCELLANEOUS) ×1 IMPLANT
ELECTRODE REM PT RTRN 9FT ADLT (ELECTROSURGICAL) ×1 IMPLANT
GLOVE BIOGEL PI IND STRL 8.5 (GLOVE) ×1 IMPLANT
GLOVE BIOGEL PI INDICATOR 8.5 (GLOVE) ×2
GLOVE ECLIPSE 8.5 STRL (GLOVE) ×3 IMPLANT
GOWN STRL REUS W/ TWL LRG LVL3 (GOWN DISPOSABLE) ×1 IMPLANT
GOWN STRL REUS W/TWL 2XL LVL3 (GOWN DISPOSABLE) ×6 IMPLANT
GOWN STRL REUS W/TWL LRG LVL3 (GOWN DISPOSABLE) ×2
KIT BASIN OR (CUSTOM PROCEDURE TRAY) ×3 IMPLANT
KIT POSITION SURG JACKSON T1 (MISCELLANEOUS) ×3 IMPLANT
KIT ROOM TURNOVER OR (KITS) ×3 IMPLANT
NEEDLE 22X1 1/2 (OR ONLY) (NEEDLE) ×3 IMPLANT
NEEDLE SPNL 18GX3.5 QUINCKE PK (NEEDLE) ×6 IMPLANT
NS IRRIG 1000ML POUR BTL (IV SOLUTION) ×3 IMPLANT
PACK LAMINECTOMY ORTHO (CUSTOM PROCEDURE TRAY) ×3 IMPLANT
PACK UNIVERSAL I (CUSTOM PROCEDURE TRAY) ×3 IMPLANT
PAD ARMBOARD 7.5X6 YLW CONV (MISCELLANEOUS) ×6 IMPLANT
PATTIES SURGICAL .5 X.5 (GAUZE/BANDAGES/DRESSINGS) IMPLANT
PATTIES SURGICAL .5 X1 (DISPOSABLE) ×3 IMPLANT
POSITIONER HEAD PRONE TRACH (MISCELLANEOUS) ×3 IMPLANT
PUTTY DBX 1CC (Putty) ×3 IMPLANT
PUTTY DBX 1CC DEPUY (Putty) ×1 IMPLANT
ROD RELINE MAS LORD 5.5X45MM (Rod) ×3 IMPLANT
ROD RELINE MAS TI LORD 5.5X40 (Rod) ×3 IMPLANT
SCREW LOCK RELINE 5.5 TULIP (Screw) ×12 IMPLANT
SCREW MAS RELINE 6.5X45 POLY (Screw) ×6 IMPLANT
SCREW MAS RELINE POLY 6.5X40 (Screw) ×6 IMPLANT
SPONGE LAP 4X18 X RAY DECT (DISPOSABLE) ×6 IMPLANT
SPONGE SURGIFOAM ABS GEL 100 (HEMOSTASIS) ×3 IMPLANT
SURGIFLO TRUKIT (HEMOSTASIS) IMPLANT
SUT BONE WAX W31G (SUTURE) ×3 IMPLANT
SUT MNCRL AB 3-0 PS2 18 (SUTURE) ×6 IMPLANT
SUT VIC AB 1 CT1 18XCR BRD 8 (SUTURE) ×1 IMPLANT
SUT VIC AB 1 CT1 8-18 (SUTURE) ×2
SUT VIC AB 2-0 CT1 18 (SUTURE) ×3 IMPLANT
SYR BULB IRRIGATION 50ML (SYRINGE) ×3 IMPLANT
SYR CONTROL 10ML LL (SYRINGE) ×3 IMPLANT
TLIF TITAN TI LG 8MM (Orthopedic Implant) ×3 IMPLANT
TOWEL OR 17X24 6PK STRL BLUE (TOWEL DISPOSABLE) ×3 IMPLANT
TOWEL OR 17X26 10 PK STRL BLUE (TOWEL DISPOSABLE) ×3 IMPLANT
TRAY FOLEY CATH 16FRSI W/METER (SET/KITS/TRAYS/PACK) ×3 IMPLANT
WATER STERILE IRR 1000ML POUR (IV SOLUTION) ×3 IMPLANT
YANKAUER SUCT BULB TIP NO VENT (SUCTIONS) ×3 IMPLANT

## 2014-10-26 NOTE — Transfer of Care (Signed)
Immediate Anesthesia Transfer of Care Note  Patient: Abigail Lawrence  Procedure(s) Performed: Procedure(s): Posterior lumbar interbody fusion with instrumentation lumbar 4-5 Open reduction of grade 3 slip (N/A)  Patient Location: PACU  Anesthesia Type:General  Level of Consciousness: sedated  Airway & Oxygen Therapy: Patient Spontanous Breathing and Patient connected to nasal cannula oxygen  Post-op Assessment: Report given to RN and Post -op Vital signs reviewed and stable  Post vital signs: stable  Last Vitals:  Filed Vitals:   10/26/14 1532  BP:   Pulse:   Temp: 37 C  Resp:     Complications: No apparent anesthesia complications

## 2014-10-26 NOTE — Anesthesia Preprocedure Evaluation (Signed)
Anesthesia Evaluation  Patient identified by MRN, date of birth, ID band Patient awake    Reviewed: Allergy & Precautions, H&P , NPO status , Patient's Chart, lab work & pertinent test results  Airway        Dental no notable dental hx.    Pulmonary neg pulmonary ROS,    Pulmonary exam normal       Cardiovascular negative cardio ROS      Neuro/Psych Depression negative neurological ROS     GI/Hepatic negative GI ROS, Neg liver ROS,   Endo/Other  diabetes, Type 1, Insulin Dependent  Renal/GU negative Renal ROS  negative genitourinary   Musculoskeletal  (+) Arthritis -, Fibromyalgia -  Abdominal   Peds  Hematology negative hematology ROS (+)   Anesthesia Other Findings   Reproductive/Obstetrics negative OB ROS                             Anesthesia Physical Anesthesia Plan  ASA: III  Anesthesia Plan: General   Post-op Pain Management:    Induction: Intravenous  Airway Management Planned: Oral ETT  Additional Equipment:   Intra-op Plan:   Post-operative Plan: Extubation in OR  Informed Consent: I have reviewed the patients History and Physical, chart, labs and discussed the procedure including the risks, benefits and alternatives for the proposed anesthesia with the patient or authorized representative who has indicated his/her understanding and acceptance.   Dental advisory given  Plan Discussed with: CRNA  Anesthesia Plan Comments:         Anesthesia Quick Evaluation

## 2014-10-26 NOTE — H&P (Signed)
History of Present Illness The patient is a 59 year old female who presents with back pain. The patient reports low back symptoms including pain (right side), low back pain, tightness and numbness (on/off right thigh) which began 2 year(s) (worse recently) ago without any known injury. Symptoms are reported to be located in the low back and Symptoms do not include incontinence of stool or incontinence of urine. The pain radiates to the right buttock, right thigh, right posterior lower leg and right foot. The patient describes the pain as sharp, burning and aching. The patient describes the severity of their symptoms as 6 / 10 on an analog pain scale. The patient feels as if the symptoms are unchanging. Symptoms are exacerbated by standing, sitting, lifting and bending. Symptoms are relieved by heat packs, while symptoms are not relieved by activity modification, cold packs or opioid analgesics. Current treatment includes non-opioid analgesics (Tramadol - Dr Betsy Coder) and opioid analgesics (Hydrocodone 5-325 mg and Neurontin). Prior to being seen today the patient was previously evaluated in this clinic (Dr Shelle Iron). Past evaluation has included MRI of the lumbar spine. Past treatment has included nonsteroidal anti-inflammatory drugs, non-opioid analgesics, physical therapy and trochanteric bursa injections.  Additional reasons for visit:  H & P is described as the following: The patient is scheduled for a PSF with Decompression L4-5 possible interbody fusion to be performed by Dr. Debria Garret D. Shon Baton, MD at [facility] on 10/26/14 . Please see the hospital record for complete dictated history and physical.  Allergies  Codeine Sulfate *ANALGESICS - OPIOID* headache after extended use Sulfa Drugs Itching, Rash.  Family History  Heart disease in female family member before age 68 Heart Disease Father. Osteoarthritis Maternal Grandmother, Mother. Hypertension Mother. First Degree Relatives  reported Chronic Obstructive Lung Disease Mother. Cancer Paternal Grandmother. Diabetes Mellitus Father. Depression Mother.  Social History  Tobacco use Never smoker. 05/26/2014 Tobacco / smoke exposure 05/26/2014: no Children 3 Current drinker 05/26/2014: Currently drinks wine only occasionally per week Current work status working full time Exercise Exercises rarely; does running / walking Living situation live with spouse Marital status married No history of drug/alcohol rehab Number of flights of stairs before winded 2-3 Under pain contract  Medication History  Hydrocodone-Acetaminophen (5-325MG  Tablet, Oral) Active. Lisinopril (  Tablet, Oral) Active. (qd) Levemir (100UNIT/ML Solution, Subcutaneous) Active. (bid) Lexapro (  Tablet, Oral) Active. (qd) Gabapentin (  Capsule, Oral) Active. (tid) HumaLOG KwikPen (100UNIT/ML Soln Pen-inj, Subcutaneous) Active. (tid) Ultram (  Tablet, Oral) Active. (tid) Medications Reconciled  Vitals  10/20/2014 2:02 PM Weight: 126 lb Height: 52in Body Surface Area: 1.38 m Body Mass Index: 32.76 kg/m  Temp.: 98.69F  Pulse: 80 (Regular)  BP: 111/60 (Sitting, Left Arm, Standard)  Physical Exam  General Mental Status -Alert and cooperative. General Appearance-Very pleasant and Cooperative.  Chest and Lung Exam Chest and lung exam reveals -non-tender and normal tactile fremitus and on auscultation, normal breath sounds, no adventitious sounds and normal vocal resonance.  Cardiovascular Cardiovascular examination reveals -normal heart sounds, regular rate and rhythm with no murmurs.  Peripheral Vascular Lower Extremity Palpation - Pulses - Bilateral - pulses intact. Calf - Bilateral - soft/supple to palpation, appearance is not suggestive of DVT.  Neurologic Motor Lower Extremity - Left - Motor function is intact in the lower extremity. Right - Motor function is diminished in the  lower extremity. Sensation Lower Extremity - Left - sensation is intact in the lower extremity. Right - sensation is diminished to light touch in the lower extremity. Reflexes  1/2 Decreased - All. Babinski - Bilateral - Babinski not present. Ankle Clonus - Bilateral - Ankle clonus not present. Testing Seated Straight Leg Raise - Left - Seated straight leg raise negative. Right - Seated straight leg raise positive.   RADIOGRAPHS: MRI and X-rays are reviewed. She does have a Grade 2, borderline Grade 3 spondylolisthesis at L4-5 with marked spinal stenosis and foraminal stenosis. There is a large central disk. The adjacent segments appear to be well maintained.  Musculoskeletal Spine/Ribs/Pelvis  Lumbosacral Spine: Assessment of pain reveals the following findings - The pain is characterized as - severe, burning, pain accumulates with activity, pain increases with sustained postures, pain on movement and shooting. Location - pain refers to buttocks on affected side and pain refers to posterior leg on affected side. Location - lumbar area and right lateral lower leg. Lumbosacral Spine - ROM - painful. ROM - Testing limited - due to pain.  Assessment & Plan Spondylolisthesis of lumbar region (M43.16) Current Plans Pt Education - Medicines: Using Them Safely: medications Pt Education - Wound Closure and Wound Care: surgery Posterior decompression/Fusion:Risks of surgery include infection, bleeding, nerve damage, death, stroke, paralysis, failure to heal, need for further surgery, ongoing or worse pain, need for further surgery, CSF leak, loss of bowel or bladder, and recurrent disc herniation or stenosis which would necessitate need for further surgery. Non-union, hardware failure, adjacent segment disease and recurrent pain. Hardware breakage, mal-position requiring surgery to correct or remove. Goal Of Surgery:Discussed that goal of surgery is to reduce pain and improve function and quality of  life. Patient is aware that despite all appropriate treatment that there pain and function could be the same, worse, or different. Right-sided low back pain with right-sided sciatica (M54.41)

## 2014-10-26 NOTE — Brief Op Note (Signed)
10/26/2014  3:09 PM  PATIENT:  Abigail Lawrence  59 y.o. female  PRE-OPERATIVE DIAGNOSIS:  GRADE 3 SPONDYLOLISTHESIS   POST-OPERATIVE DIAGNOSIS:  GRADE 3 SPONDYLOLISTHESIS   PROCEDURE:  Procedure(s): Posterior lumbar interbody fusion with instrumentation lumbar 4-5 Open reduction of grade 3 slip (N/A)  SURGEON:  Surgeon(s) and Role:    * Venita Lickahari Davidlee Jeanbaptiste, MD - Primary  PHYSICIAN ASSISTANT:   ASSISTANTS: none   ANESTHESIA:   general  EBL:  Total I/O In: 2500 [I.V.:2000; IV Piggyback:500] Out: 885 [Urine:285; Blood:600]  BLOOD ADMINISTERED:none  DRAINS: none   LOCAL MEDICATIONS USED:  MARCAINE     SPECIMEN:  No Specimen  DISPOSITION OF SPECIMEN:  N/A  COUNTS:  YES  TOURNIQUET:  * No tourniquets in log *  DICTATION: .Other Dictation: Dictation Number [id number]  PLAN OF CARE: Admit to inpatient   PATIENT DISPOSITION:  PACU - hemodynamically stable.

## 2014-10-26 NOTE — Anesthesia Procedure Notes (Signed)
Procedure Name: Intubation Date/Time: 10/26/2014 11:28 AM Performed by: Minus LibertyAVENEL, Joandy Burget Pre-anesthesia Checklist: Patient identified, Timeout performed, Emergency Drugs available, Suction available and Patient being monitored Patient Re-evaluated:Patient Re-evaluated prior to inductionOxygen Delivery Method: Circle system utilized Preoxygenation: Pre-oxygenation with 100% oxygen Intubation Type: IV induction Ventilation: Mask ventilation without difficulty Laryngoscope Size: Mac and 3 Grade View: Grade II Tube type: Oral Tube size: 7.5 mm Number of attempts: 1 Airway Equipment and Method: Stylet Placement Confirmation: positive ETCO2,  CO2 detector,  ETT inserted through vocal cords under direct vision and breath sounds checked- equal and bilateral Secured at: 23 cm Tube secured with: Tape Dental Injury: Teeth and Oropharynx as per pre-operative assessment

## 2014-10-26 NOTE — Anesthesia Postprocedure Evaluation (Signed)
Anesthesia Post Note  Patient: Abigail Lawrence  Procedure(s) Performed: Procedure(s) (LRB): Posterior lumbar interbody fusion with instrumentation lumbar 4-5 Open reduction of grade 3 slip (N/A)  Anesthesia type: general  Patient location: PACU  Post pain: Pain level controlled  Post assessment: Patient's Cardiovascular Status Stable  Last Vitals:  Filed Vitals:   10/26/14 1700  BP:   Pulse: 94  Temp:   Resp: 15    Post vital signs: Reviewed and stable  Level of consciousness: sedated  Complications: No apparent anesthesia complications

## 2014-10-27 ENCOUNTER — Inpatient Hospital Stay (HOSPITAL_COMMUNITY): Payer: PRIVATE HEALTH INSURANCE

## 2014-10-27 LAB — GLUCOSE, CAPILLARY
GLUCOSE-CAPILLARY: 215 mg/dL — AB (ref 70–99)
GLUCOSE-CAPILLARY: 241 mg/dL — AB (ref 70–99)
GLUCOSE-CAPILLARY: 226 mg/dL — AB (ref 70–99)
Glucose-Capillary: 227 mg/dL — ABNORMAL HIGH (ref 70–99)
Glucose-Capillary: 292 mg/dL — ABNORMAL HIGH (ref 70–99)
Glucose-Capillary: 312 mg/dL — ABNORMAL HIGH (ref 70–99)
Glucose-Capillary: 241 mg/dL — ABNORMAL HIGH (ref 70–99)

## 2014-10-27 LAB — HEMOGLOBIN AND HEMATOCRIT, BLOOD
HEMATOCRIT: 24.5 % — AB (ref 36.0–46.0)
HEMOGLOBIN: 8.3 g/dL — AB (ref 12.0–15.0)

## 2014-10-27 MED ORDER — INSULIN ASPART 100 UNIT/ML ~~LOC~~ SOLN
0.0000 [IU] | Freq: Every day | SUBCUTANEOUS | Status: DC
  Administered 2014-10-27: 100 [IU] via SUBCUTANEOUS
  Administered 2014-10-28: 3 [IU] via SUBCUTANEOUS

## 2014-10-27 MED ORDER — ACETAMINOPHEN 325 MG PO TABS
650.0000 mg | ORAL_TABLET | Freq: Once | ORAL | Status: AC
  Administered 2014-10-27: 650 mg via ORAL
  Filled 2014-10-27: qty 2

## 2014-10-27 MED ORDER — INSULIN DETEMIR 100 UNIT/ML ~~LOC~~ SOLN
12.0000 [IU] | Freq: Two times a day (BID) | SUBCUTANEOUS | Status: DC
  Administered 2014-10-27 – 2014-10-29 (×4): 12 [IU] via SUBCUTANEOUS
  Filled 2014-10-27 (×5): qty 0.12

## 2014-10-27 MED ORDER — INSULIN ASPART 100 UNIT/ML ~~LOC~~ SOLN
3.0000 [IU] | Freq: Three times a day (TID) | SUBCUTANEOUS | Status: DC
  Administered 2014-10-27 – 2014-10-29 (×5): 3 [IU] via SUBCUTANEOUS

## 2014-10-27 MED ORDER — FERROUS SULFATE 325 (65 FE) MG PO TABS
325.0000 mg | ORAL_TABLET | Freq: Three times a day (TID) | ORAL | Status: DC
  Administered 2014-10-27 – 2014-10-29 (×8): 325 mg via ORAL
  Filled 2014-10-27 (×11): qty 1

## 2014-10-27 MED ORDER — INSULIN ASPART 100 UNIT/ML ~~LOC~~ SOLN
0.0000 [IU] | Freq: Three times a day (TID) | SUBCUTANEOUS | Status: DC
  Administered 2014-10-27 – 2014-10-28 (×2): 11 [IU] via SUBCUTANEOUS
  Administered 2014-10-28 (×2): 5 [IU] via SUBCUTANEOUS
  Administered 2014-10-29: 8 [IU] via SUBCUTANEOUS
  Administered 2014-10-29: 15 [IU] via SUBCUTANEOUS

## 2014-10-27 NOTE — Evaluation (Signed)
Physical Therapy Evaluation and Discharge  Patient Details Name: Abigail Lawrence MRN: [id number] DOB: 1956-02-25 Today's Date: 10/27/2014   History of Present Illness  Pt is a 59 yo female s/p PLIF L4-L5.   Clinical Impression  Patient evaluated by Physical Therapy with no further acute PT needs identified. All education has been completed and the patient has no further questions.  See below for any follow-up Physial Therapy or equipment needs. PT is signing off. Thank you for this referral.    Follow Up Recommendations No PT follow up;Supervision for mobility/OOB    Equipment Recommendations  None recommended by PT    Recommendations for Other Services OT consult     Precautions / Restrictions Precautions Precautions: Back Precaution Booklet Issued: Yes (comment) Precaution Comments: reviewed handout and precautions  Required Braces or Orthoses: Spinal Brace Spinal Brace: Lumbar corset;Applied in sitting position Restrictions Weight Bearing Restrictions: No      Mobility  Bed Mobility Overal bed mobility: Modified Independent             General bed mobility comments: reviewed log rolling technique; pt demo good ability to perform   Transfers Overall transfer level: Modified independent Equipment used: None             General transfer comment: incr time; no LOB noted  Ambulation/Gait Ambulation/Gait assistance: Supervision;Modified independent (Device/Increase time) Ambulation Distance (Feet): 400 Feet Assistive device: None Gait Pattern/deviations: Step-through pattern;Decreased stride length Gait velocity: guarded Gait velocity interpretation: Below normal speed for age/gender General Gait Details: min cues for back precautions with directional changes; no LOB noted; guarded gt due to surgical pain   Stairs Stairs: Yes Stairs assistance: Min assist Stair Management: No rails;Step to pattern;Forwards Number of Stairs: 3 General stair comments: cues for  technique; handheld (A) to simulate home with husband and due to no handrails  Wheelchair Mobility    Modified Rankin (Stroke Patients Only)       Balance Overall balance assessment: No apparent balance deficits (not formally assessed)                                           Pertinent Vitals/Pain Pain Assessment: 0-10 Pain Score: 5  Pain Location: lower back, along the incision line Pain Descriptors / Indicators:  (surgical pain) Pain Intervention(s): Monitored during session;Repositioned (pt reports pain feels better when walking)    Home Living Family/patient expects to be discharged to:: Private residence Living Arrangements: Spouse/significant other;Other (Comment) (daughter lives close by) Available Help at Discharge: Family;Available 24 hours/day Type of Home: House Home Access: Stairs to enter Entrance Stairs-Rails: None Entrance Stairs-Number of Steps: 2 Home Layout: One level Home Equipment: Shower seat;Hand held shower head;Grab bars - tub/shower      Prior Function Level of Independence: Independent               Hand Dominance   Dominant Hand: Right    Extremity/Trunk Assessment   Upper Extremity Assessment: Overall WFL for tasks assessed           Lower Extremity Assessment: Overall WFL for tasks assessed      Cervical / Trunk Assessment: Normal  Communication   Communication: No difficulties  Cognition Arousal/Alertness: Awake/alert Behavior During Therapy: WFL for tasks assessed/performed Overall Cognitive Status: Within Functional Limits for tasks assessed  General Comments General comments (skin integrity, edema, etc.): reviewed wear and safety of back brace; reviewed car transfer technique and encouraged OOB activity with family    Exercises        Assessment/Plan    PT Assessment Patent does not need any further PT services  PT Diagnosis Acute pain   PT Problem List     PT Treatment Interventions     PT Goals (Current goals can be found in the Care Plan section) Acute Rehab PT Goals Patient Stated Goal: home tomorrow PT Goal Formulation: All assessment and education complete, DC therapy    Frequency     Barriers to discharge        Co-evaluation               End of Session Equipment Utilized During Treatment: Back brace Activity Tolerance: Patient tolerated treatment well Patient left: in bed;with call bell/phone within reach Nurse Communication: Mobility status         Time: [id number] PT Time Calculation (min) (ACUTE ONLY): 24 min   Charges:   PT Evaluation $Initial PT Evaluation Tier I: 1 Procedure PT Treatments $Gait Training: 8-22 mins   PT G CodesDonell Sievert:        Aithan Farrelly N, [place]PT  [phone] 10/27/2014, 2:20 PM

## 2014-10-27 NOTE — Evaluation (Signed)
Occupational Therapy Evaluation Patient Details Name: Abigail Lawrence MRN: [id number] DOB: 26-Jun-1956 Today's Date: 10/27/2014    History of Present Illness Pt is a 59 yo female s/p PLIF L4-L5.    Clinical Impression   Patient admitted with above. Patient independent > min assist (secondary to pain) PTA. Patient currently functioning at an overall mod I level. D/C from acute OT services and no additional follow-up OT needs at this time. All appropriate education provided to patient. Please re-order OT as needed.      Follow Up Recommendations  No OT follow up;Supervision - Intermittent    Equipment Recommendations  None recommended by OT (Patient states she has a BSC and shower seat)    Recommendations for Other Services  None at this time.    Precautions / Restrictions Precautions Precautions: Back Precaution Booklet Issued: Yes (comment) Precaution Comments: reviewed handout and precautions  Required Braces or Orthoses: Spinal Brace Spinal Brace: Lumbar corset;Applied in sitting position Restrictions Weight Bearing Restrictions: No      Mobility Bed Mobility Overal bed mobility: Modified Independent             General bed mobility comments: Pt demonstrated good technique and safe form for bed mobility without use of rails or HOB elevated. Patient mod I secondary to increased time.  Transfers Overall transfer level: Modified independent Equipment used: None             General transfer comment: Patient mod I for increased time.     Balance Overall balance assessment: No apparent balance deficits (not formally assessed)    ADL Overall ADL's : Modified independent General ADL Comments: Patient able to cross BLEs for LB ADLs. Patient performing at an overall modified independent level for functional tasks, transfers, safety, and ADLs. Patient states her husband and/or daughter will be available prn. Patient mod I for increased time needed and need for DME during  toielting and bathing.  Discussed importance of using BSC in shower and need to sit during shower secondary to back brace wearing orders.     Pertinent Vitals/Pain Pain Assessment: 0-10 Pain Score: 5  Pain Location: lower back, along the incision line Pain Descriptors / Indicators:  (surgical pain) Pain Intervention(s): Monitored during session;Repositioned (pt reports pain feels better when walking)     Hand Dominance Right   Extremity/Trunk Assessment Upper Extremity Assessment Upper Extremity Assessment: Overall WFL for tasks assessed   Lower Extremity Assessment Lower Extremity Assessment: Overall WFL for tasks assessed   Cervical / Trunk Assessment Cervical / Trunk Assessment: Normal   Communication Communication Communication: No difficulties   Cognition Arousal/Alertness: Awake/alert Behavior During Therapy: WFL for tasks assessed/performed Overall Cognitive Status: Within Functional Limits for tasks assessed              Home Living Family/patient expects to be discharged to:: Private residence Living Arrangements: Spouse/significant other;Other (Comment) (daughter lives close by) Available Help at Discharge: Family;Available 24 hours/day Type of Home: House Home Access: Stairs to enter [employer] of Steps: 2 Entrance Stairs-Rails: None Home Layout: One level     Bathroom Shower/Tub: Walk-in Pensions consultant: Standard     Home Equipment: Shower seat;Hand held shower head;Grab bars - tub/shower    Prior Functioning/Environment Level of Independence: Independent      OT Diagnosis: Generalized weakness;Acute pain   OT Problem List:  n/a, no acute OT needs   OT Treatment/Interventions:   n/a, no acute OT needs   OT Goals(Current goals can be found  in the care plan section) Acute Rehab OT Goals Patient Stated Goal: go home  OT Frequency:  n/a, no acute OT needs   Barriers to D/C:  None known at this time           End of Session Equipment Utilized During Treatment: Back brace  Activity Tolerance: Patient tolerated treatment well Patient left: in bed;with call bell/phone within reach   Time: 1400-1426 OT Time Calculation (min): 26 min Charges:  OT General Charges $OT Visit: 1 Procedure OT Evaluation $Initial OT Evaluation Tier I: 1 Procedure OT Treatments $Self Care/Home Management : 8-22 mins  Thomasine Klutts , MS, OTR/L, CLT Pager: [phone]  10/27/2014, 2:38 PM

## 2014-10-27 NOTE — Op Note (Signed)
NAME:  MANROOP, JAKUBOWICZ NO.:  [id number]  MEDICAL RECORD NO.:  [id number]  LOCATION:  [id number]                        FACILITY:  [facility]  PHYSICIAN:  Alvy Beal, MD    DATE OF BIRTH:  1956/04/22  DATE OF PROCEDURE:  10/26/2014 DATE OF DISCHARGE:                              OPERATIVE REPORT   PREOPERATIVE DIAGNOSIS:  Grade 3 spondylolisthesis, L4-5 with bilateral neuropathic leg pain, right side worse than the left.  POSTOPERATIVE DIAGNOSIS:  Grade 3 spondylolisthesis, L4-5 with bilateral neuropathic leg pain, right side worse than the left.  OPERATIVE PROCEDURES: 1. Open bilateral Gill decompression at L4-5 with complete facetectomy     of the inferior L4 facet and complete decompression, L4-5. 2. Open reduction of grade 3 slipped to grade 1.  Complete diskectomy     with implantation of intervertebral biomechanical device (TLIF)     Titan Titanium size 8 lordotic large cage packed with local bone     autograft harvest and DBX. 3. Posterior segmental instrumentation for fusion, L4-5 with NuVasive     pedicle screw system and posterolateral arthrodesis, L4-5 left side     with autograft bone.  COMPLICATIONS:  None.  CONDITION:  Stable.  HISTORY:  This is a very pleasant 59 year old woman who has been having severe debilitating back, buttock, and bilateral leg pain, right side worse than the left.  Attempts at conservative management had failed to alleviate her symptoms and as a result, we elected to proceed with surgery.  All appropriate risks, benefits and alternatives were discussed with the patient and consent was obtained.  OPERATIVE NOTE:  The patient was brought to the operating room and placed supine on the operating table.  After successful induction of general anesthesia and endotracheal intubation, TEDs, SCDs and a Foley were inserted and the patient was turned prone onto the Wilson frame. All bony prominences were well padded and the back was  prepped and draped in a standard fashion.  Time-out was taken to confirm the patient, procedure, and the affected extremity.  Once this was done, x- ray was brought sterilely into the field to identify the L4 and L5 pedicles.  A midline incision was planned out and then incision site was infiltrated with 0.25% Marcaine.  A midline incision was made starting at the superior aspect of L4 and proceeding to the inferior aspect of L5.  Sharp dissection was carried out down to the deep fascia.  I incised the deep fascia and stripped the paraspinal muscles to expose the L4 and L5 spinous process.  Then, I exposed the L3-4 facet complex and kept it intact, dissected out laterally to this to expose the L4 transverse process.  I then went down to the L4-5 facet complex, decorticated this (removed the entire facet complex) and then identified the L5 transverse process.  Once I had done this, I then went to the contralateral side and performed the same posterolateral approach.  At this point, the complete posterior lateral aspect of the spine exposed. I then placed a pedicle probe on the junction of the transverse process and facet complex and then advanced it through the pedicle and into the vertebral body.  I confirmed trajectory with the fluoro.  I then removed the pedicle probe, palpated it with a ball-tip Feeler to confirm I had a solid bony canal, tapped, and then repalpated with ball-tipped Feeler. Once I confirmed that I had a solid bony canal, I placed a 40-mm length 6.5 diameter screw into L5.  I repeated the same procedure at L4, except this time, I put a 45-mm length screw.  I went to the contralateral side and placed the same set of pedicle screws using the same technique.  At this point, once all 4 pedicle screws were in place, I then proceeded with the decompression.  Because of the bilateral nature of this, I elected to do an open posterior decompression, facetectomy and foraminotomy.   The entire posterior spinous process of L4 was removed and I performed a generous laminotomy using my Leksell rongeur.  Once I had done this, I then dissected through the ligamentum flavum using a Penfield 4 and until I was able to generate a plane between the ligamentum flavum and the thecal sac.  I then placed the neural patty in this interval and then used my 3-mm Kerrison to resect the ligamentum flavum from the central region.  Once I had a complete central decompression, I carried my laminotomy superiorly until I had an adequate decompression centrally.  I then went into the lateral gutter and removed the entire inferior L4 facet complex.  Because the right side was more prominent, I also removed the pars of L4 on this side. Again, this was accomplished using my 2 and 3-mm Kerrison.  I could now identify the L5 nerve root and the medial aspect of the L5 pedicle.  I palpated this and performed a foraminotomy of L5 with my 3-mm Kerrison. I then palpated around to the inferior aspect of the pedicle confirming that there was no breach.  I then went superiorly, identified the disk space and then ultimately identified the L4 nerve root.  I then removed all the overlying fibrous tissue to decompress the nerve.  I could now palpate the inferior and medial aspect of the L4 pedicle.  Again, there was no evidence of breach of the pedicle.  At this point, I then coagulated the very large epidural veins with bipolar electrocautery.  I then went to the contralateral side, which will be the left side and performed a similar decompression.  The only difference was I did not sacrifice the completely remove the L4 pars on this side.  The left side since it was not having as much radicular symptoms, I elected to place my posterolateral bone graft and so I needed to leave some bony anatomy presents so that I could put my bone graft on it and would migrate.  At this point, I could palpate and visualize  the 4 and 5 nerve roots on the left side and they were completely decompressed.  I could also visualize the posterolateral aspect of the disk space.  I again identified the large epidural veins and coagulated them to obtain hemostasis.  I then went back to the right-hand side of the patient, protected the thecal sac and incised the annulus with a 15-blade scalpel.  Using a combination of pituitary rongeurs, curettes, and Kerrison rongeurs, I removed the bulk of the disk material.  I then swept underneath the annulus removing the posterior disk herniation that was central and superior behind the body of L4.  Once I had this done, I was actually able to reduce the slip.  The slip now reduced to about a grade 1.  It was a significant improvement.  At this point, I elected to place an interbody fixation because I was able to create disk space and reduced it to a point where I felt as though it would be adequate endplate to cover the implant.  Once I had scraped across and I cleansed the disk space of any cartilaginous material, I then went back to the left-hand side, incised this disk and also cleaned out the disk from this side. At this point, I had excellent near-complete diskectomy.  I then trialed and placed the size 8 large Titan Titanium cage.  It was packed with autograft bone from the decompression along with DBX.  I malleted it until it go into the horizontal position.  I could visualize it on both sides of the thecal sac.  It was not prominent and it was not contracting or compressing the thecal sac or the nerve root.  At this point, I was very pleased with the fixation.  It was very firm and was well fitting.  I then dropped the kyphosis out of the Wilson frame and placed rods across the pedicle screw heads.  They were then locked down and torqued off according to manufacture's standard.  At this point, with the posterior instrumented fusion intact, I was pleased with  my decompression and fusion.  I again checked with my [facility] elevator centrally and in the lateral recess going superiorly and inferiorly and at the 5 foramen, and at the 4 foramen, and there was an adequate decompression.  I then decorticated the transverse process of 5 and 4 using the high-speed bur on the left side and packed my bone graft in the posterolateral gutter on this left side.  I irrigated the wound copiously with normal saline and I made sure I had hemostasis using bipolar electrocautery.  I placed a large thrombin-soaked Gelfoam patty over the exposed lamina defect and then closed the deep fascia with interrupted #1 Vicryl sutures, superficial with 2-0 Vicryl sutures and a 3-0 Monocryl for the skin.  Steri-Strips and dry dressing were applied, and the patient was extubated, transferred to the PACU without incident.  At the end of the case, all needle and sponge counts were correct.  There were no adverse intraoperative events.     Alvy Bealahari D Geri Hepler, MD     DDB/MEDQ  D:  10/26/2014  T:  10/27/2014  Job:  [id number]

## 2014-10-27 NOTE — Progress Notes (Signed)
SCHEDULED IV ACETAMINOPHEN:  CONVERSION TO ORAL ROUTE to complete the ordered doses.  The Pharmacy and Therapeutics Committee has restricted administration of IV acetaminophen (with a 24 hr maximum duration) to patients who meet both of the following criteria:  Unable to tolerate oral or enteral medication  Contraindication to NSAIDs  Because the patient has taken other oral medications today post-op, IV acetaminophen has been converted to PO to complete the course of therapy originally ordered.  If PO acetaminophen should be continued beyond the original stop time, please adjust the order accordingly using the "modify" function.   If you have questions about this conversion, please contact the pharmacy department.  Brigid ReBajbus, Jacci Ruberg, [facility] Center[facility]PH 10/27/2014 10:33 AM

## 2014-10-27 NOTE — Progress Notes (Signed)
    Subjective: Procedure(s) (LRB): Posterior lumbar interbody fusion with instrumentation lumbar 4-5 Open reduction of grade 3 slip (N/A) 1 Day Post-Op  Patient reports pain as 2 on 0-10 scale.  Reports decreased leg pain reports incisional back pain   Positive void Negative bowel movement Positive flatus Negative chest pain or shortness of breath  Objective: Vital signs in last 24 hours: Temp:  [97.6 F (36.4 C)-98.7 F (37.1 C)] 98.2 F (36.8 C) (02/11 0626) Pulse Rate:  [71-94] 77 (02/11 0626) Resp:  [8-17] 13 (02/10 1721) BP: (88-133)/(31-57) 122/47 mmHg (02/11 0626) SpO2:  [93 %-100 %] 100 % (02/11 0626)  Intake/Output from previous day: 02/10 0701 - 02/11 0700 In: 4425.8 [P.O.:440; I.V.:3485.8; IV Piggyback:500] Out: 1965 [Urine:1365; Blood:600]  Labs:  Recent Labs  10/26/14 1928 10/27/14 0700  HCT 26.1* 24.5*    Recent Labs  10/26/14 1554  NA 137  K 3.9  GLUCOSE 314*   No results for input(s): LABPT, INR in the last 72 hours.  Physical Exam: Neurologically intact ABD soft Neurovascular intact Intact pulses distally Incision: dressing C/D/I Compartment soft    Assessment/Plan: Patient stable  xrays satisfactory Continue mobilization with physical therapy Continue care  HCT remains low: anemia post-op bleeding.   Abd soft/NT - patient remains hemodynamically stable. Will start FeSO4 and recheck H/H in the AM Will monitor exam - hold on transfusion as long as she is asymptomatic  Venita Lickahari Righteous Claiborne, MD [facility] [phone]

## 2014-10-27 NOTE — Progress Notes (Signed)
Utilization review completed.  

## 2014-10-27 NOTE — Progress Notes (Signed)
Inpatient Diabetes Program Recommendations  AACE/ADA: New Consensus Statement on Inpatient Glycemic Control (2013)  Target Ranges:  Prepandial:   less than 140 mg/dL      Peak postprandial:   less than 180 mg/dL (1-2 hours)      Critically ill patients:  140 - 180 mg/dL   Reason for Assessment: Hyperglycemia  Diabetes history: DM2 Outpatient Diabetes medications: Levemir 10 units bid, Humalog s/s tidwc Current orders for Inpatient glycemic control: Levemir 10 units bid, Novolog moderate Q4H  Hyperglycemia with Decadron 4 mg Q6H. HgbA1C pending.   Inpatient Diabetes Program Recommendations Insulin - Basal: Increase Levemir to 12 units bid Correction (SSI): Change Novolog to moderate tidwc and hs since pt is eating Insulin - Meal Coverage: Consider Novolog 3 units tidwc while on steroids HgbA1C: Pending  Will follow. Thank you. Abigail Lawrence, RD, LDN, CDE Inpatient Diabetes Coordinator [phone]

## 2014-10-28 LAB — GLUCOSE, CAPILLARY
GLUCOSE-CAPILLARY: 243 mg/dL — AB (ref 70–99)
Glucose-Capillary: 221 mg/dL — ABNORMAL HIGH (ref 70–99)
Glucose-Capillary: 245 mg/dL — ABNORMAL HIGH (ref 70–99)
Glucose-Capillary: 255 mg/dL — ABNORMAL HIGH (ref 70–99)
Glucose-Capillary: 313 mg/dL — ABNORMAL HIGH (ref 70–99)

## 2014-10-28 LAB — HEMOGLOBIN A1C
Hgb A1c MFr Bld: 8.4 % — ABNORMAL HIGH (ref 4.8–5.6)
Mean Plasma Glucose: 194 mg/dL

## 2014-10-28 LAB — HEMOGLOBIN AND HEMATOCRIT, BLOOD
HCT: 24.4 % — ABNORMAL LOW (ref 36.0–46.0)
Hemoglobin: 8.1 g/dL — ABNORMAL LOW (ref 12.0–15.0)

## 2014-10-28 MED ORDER — MAGNESIUM CITRATE PO SOLN
0.5000 | Freq: Once | ORAL | Status: AC
  Administered 2014-10-28: 0.5 via ORAL
  Filled 2014-10-28: qty 296

## 2014-10-28 NOTE — Progress Notes (Signed)
    Subjective: Procedure(s) (LRB): Posterior lumbar interbody fusion with instrumentation lumbar 4-5 Open reduction of grade 3 slip (N/A) 2 Days Post-Op  Patient reports pain as 2 on 0-10 scale.  Reports none leg pain reports incisional back pain   Positive void Negative bowel movement Positive flatus Negative chest pain or shortness of breath  Objective: Vital signs in last 24 hours: Temp:  [98 F (36.7 C)-98.4 F (36.9 C)] 98 F (36.7 C) (02/12 0431) Pulse Rate:  [73-84] 73 (02/12 0431) Resp:  [14-15] 15 (02/12 0431) BP: (105-125)/(45-53) 125/53 mmHg (02/12 0431) SpO2:  [96 %-98 %] 97 % (02/12 0431)  Intake/Output from previous day: 02/11 0701 - 02/12 0700 In: 720 [P.O.:720] Out: -   Labs:  Recent Labs  10/27/14 0700 10/28/14 0615  HCT 24.5* 24.4*    Recent Labs  10/26/14 1554  NA 137  K 3.9  GLUCOSE 314*   No results for input(s): LABPT, INR in the last 72 hours.  Physical Exam: Neurologically intact ABD soft Intact pulses distally Incision: dressing C/D/I Compartment soft  Assessment/Plan: Patient stable  xrays satisfactory Continue mobilization with physical therapy Continue care  Advance diet Up with therapy  HCT stabilized at 24.1 (yesterday 24.5) No signs of bleeding - abd soft/nt, wound C/D/I.  Vital signs stable, nl urine output Plan: address constipation today          Recheck HCT in AM - if remains stable then ok for d/c to home   Abigail Lickahari Cardarius Senat, MD [facility] [phone]

## 2014-10-28 NOTE — Progress Notes (Signed)
Inpatient Diabetes Program Recommendations  AACE/ADA: New Consensus Statement on Inpatient Glycemic Control (2013)  Target Ranges:  Prepandial:   less than 140 mg/dL      Peak postprandial:   less than 180 mg/dL (1-2 hours)      Critically ill patients:  140 - 180 mg/dL   Reason for Visit: Hyperglycemia  Results for Domenica FailFRANK, Abigail (MRN [id number]) as of 10/28/2014 14:37  Ref. Range 10/27/2014 16:25 10/27/2014 20:35 10/28/2014 00:52 10/28/2014 04:33 10/28/2014 11:53  Glucose-Capillary Latest Range: 70-99 mg/dL 409312 (H) 811241 (H) 914245 (H) 243 (H) 313 (H)   FBS and post-prandials still elevated.  Recommendations: Increase Levemir to 10 units bid (home dose) Increase Novolog to 6 units tidwc for meal coverage insulin if pt eats >50% meal. HgbA1C - 8.4% - will need to f/u with PCP after discharge for insulin adjustments.  Thank you. Ailene Ardshonda Keileigh Vahey, RD, LDN, CDE Inpatient Diabetes Coordinator [phone]

## 2014-10-29 LAB — GLUCOSE, CAPILLARY
GLUCOSE-CAPILLARY: 354 mg/dL — AB (ref 70–99)
GLUCOSE-CAPILLARY: 270 mg/dL — AB (ref 70–99)

## 2014-10-29 LAB — HEMOGLOBIN AND HEMATOCRIT, BLOOD
HCT: 23.7 % — ABNORMAL LOW (ref 36.0–46.0)
HEMOGLOBIN: 7.8 g/dL — AB (ref 12.0–15.0)

## 2014-10-29 MED ORDER — INSULIN ASPART 100 UNIT/ML ~~LOC~~ SOLN: 6.0000 [IU] | Freq: Three times a day (TID) | SUBCUTANEOUS | Status: DC

## 2014-10-29 MED ORDER — FERROUS SULFATE 325 (65 FE) MG PO TABS: 325.0000 mg | ORAL_TABLET | Freq: Three times a day (TID) | ORAL | Status: DC

## 2014-10-29 NOTE — Plan of Care (Signed)
Problem: Consults Goal: Diagnosis - Spinal Surgery Outcome: Completed/Met Date Met:  10/29/14 Thoraco/Lumbar Spine Fusion L4-L5

## 2014-10-29 NOTE — Progress Notes (Signed)
Inpatient Diabetes Program Recommendations  AACE/ADA: New Consensus Statement on Inpatient Glycemic Control (2013)  Target Ranges:  Prepandial:   less than 140 mg/dL      Peak postprandial:   less than 180 mg/dL (1-2 hours)      Critically ill patients:  140 - 180 mg/dL   Reason for assessment: request by staff to assess insulin   Outpatient Diabetes medications: Levemir 10 units bid, Humalog 0-15 units tid Current orders for Inpatient glycemic control: Levemir 12 units bid, Novolog 6 units tid, Novolog 0-15 units tid and hs  Spoke at length with patient by phone.  She sees Dr. Corwin LevinsWilliam Smith, [facility], [address] for her diabetes- she saw him 2 weeks ago and her A1C was 7.9% at that time.  Patient had insulin adjusted at that time and was ordered;  1. Meal coverage: Humalog 1:8 carb ratio for breakfast, 1:10 for lunch and 1:7 for supper.  2. Correction insulin: Humalog 0-15 units tid and hs 3. Basal insulin: Levemir 10 units bid  Recommend the same correction and meal coverage MD had ordered and Levemir 14 units bid.  It is my evaluation, that the patient will likely make her own adjustments at home if she does not agree with what is ordered on discharge.  Patient checks blood sugars multiple times in a day and even corrects sugars if she chooses not to eat and blood sugars are elevated.  She carries glucose tablets with her at all times and knows how to appropriately treat a low blood sugar.   Susette RacerJulie Jacquline Terrill, RN, BA, MHA, CDE Diabetes Coordinator Inpatient Diabetes Program  [phone] (Team Pager) [phone] Patrcia Dolly([place] Office) 10/29/2014 11:03 AM

## 2014-10-29 NOTE — Progress Notes (Signed)
Subjective: 3 Days Post-Op Procedure(s) (LRB): Posterior lumbar interbody fusion with instrumentation lumbar 4-5 Open reduction of grade 3 slip (N/A) Patient reports pain as mild.  No CP, SOB, dizziness with ambulation.  + BM.  Tolerating regular diet.  Denies numbness, tingling or weakness of LEs.   Objective: Vital signs in last 24 hours: Temp:  [98.6 F (37 C)] 98.6 F (37 C) (02/13 0759) Pulse Rate:  [69-72] 72 (02/13 0759) Resp:  [16] 16 (02/13 0759) BP: (98-111)/(41-42) 98/41 mmHg (02/13 0759) SpO2:  [98 %-100 %] 100 % (02/13 0759)  Intake/Output from previous day: 02/12 0701 - 02/13 0700 In: 980 [P.O.:980] Out: -  Intake/Output this shift:     Recent Labs  10/26/14 1554 10/26/14 1928 10/27/14 0700 10/28/14 0615  HGB 8.8* 8.7* 8.3* 8.1*    Recent Labs  10/27/14 0700 10/28/14 0615  HCT 24.5* 24.4*    Recent Labs  10/26/14 1554  NA 137  K 3.9  GLUCOSE 314*   No results for input(s): LABPT, INR in the last 72 hours.  PE:  wn wd woma in nad.    Assessment/Plan: 3 Days Post-Op Procedure(s) (LRB): Posterior lumbar interbody fusion with instrumentation lumbar 4-5 Open reduction of grade 3 slip (N/A) Discharge home with home health  Louis Ivery, [facility] 10/29/2014, 8:06 AM

## 2014-10-29 NOTE — Discharge Instructions (Signed)
Please contact PCP on Monday to arrange follow up for diabetic management

## 2014-11-03 NOTE — Discharge Summary (Signed)
Patient ID: Abigail Lawrence MRN: [id number] DOB/AGE: Oct 05, 1955 59 y.o.  Admit date: 10/26/2014 Discharge date: 11/03/2014  Admission Diagnoses:  Active Problems:   Back pain   Discharge Diagnoses:  Active Problems:   Back pain  status post Procedure(s): Posterior lumbar interbody fusion with instrumentation lumbar 4-5 Open reduction of grade 3 slip  Past Medical History  Diagnosis Date  . Arthritis   . Diabetes mellitus without complication   . Fibromyalgia   . Depression     Surgeries: Procedure(s): Posterior lumbar interbody fusion with instrumentation lumbar 4-5 Open reduction of grade 3 slip on 10/26/2014   Consultants:    Discharged Condition: Improved  Hospital Course: Abigail Faildith Laker is an 59 y.o. female who was admitted 10/26/2014 for operative treatment of <principal problem not specified>. Patient failed conservative treatments (please see the history and physical for the specifics) and had severe unremitting pain that affects sleep, daily activities and work/hobbies. After pre-op clearance, the patient was taken to the operating room on 10/26/2014 and underwent  Procedure(s): Posterior lumbar interbody fusion with instrumentation lumbar 4-5 Open reduction of grade 3 slip.    Patient was given perioperative antibiotics:  Anti-infectives    Start     Dose/Rate Route Frequency Ordered Stop   10/26/14 1900  ceFAZolin (ANCEF) IVPB 1 g/50 mL premix     1 g 100 mL/hr over 30 Minutes Intravenous Every 8 hours 10/26/14 1759 10/27/14 0308   10/25/14 1439  ceFAZolin (ANCEF) IVPB 2 g/50 mL premix     2 g 100 mL/hr over 30 Minutes Intravenous 30 min pre-op 10/25/14 1439 10/26/14 1116       Patient was given sequential compression devices and early ambulation to prevent DVT.   Patient benefited maximally from hospital stay and there were no complications. At the time of discharge, the patient was urinating/moving their bowels without difficulty, tolerating a regular diet,  pain is controlled with oral pain medications and they have been cleared by PT/OT.   Recent vital signs: No data found.    Recent laboratory studies: No results for input(s): WBC, HGB, HCT, PLT, NA, K, CL, CO2, BUN, CREATININE, GLUCOSE, INR, CALCIUM in the last 72 hours.  Invalid input(s): PT, 2   Discharge Medications:     Medication List    STOP taking these medications        ibuprofen 800 MG tablet  Commonly known as:  ADVIL,MOTRIN     traMADol 50 MG tablet  Commonly known as:  ULTRAM      TAKE these medications        docusate sodium 100 MG capsule  Commonly known as:  COLACE  Take 1 capsule (100 mg total) by mouth 3 (three) times daily as needed for mild constipation.     escitalopram 10 MG tablet  Commonly known as:  LEXAPRO  Take 10 mg by mouth daily.     ferrous sulfate 325 (65 FE) MG tablet  Take 1 tablet (325 mg total) by mouth 3 (three) times daily with meals.     gabapentin 100 MG capsule  Commonly known as:  NEURONTIN  Take 100 mg by mouth 3 (three) times daily.     HUMALOG 100 UNIT/ML injection  Generic drug:  insulin lispro  Inject 0-15 Units into the skin 3 (three) times daily. Per sliding scale     LEVEMIR 100 UNIT/ML injection  Generic drug:  insulin detemir  Inject 10 Units into the skin 2 (two) times daily.  lisinopril 10 MG tablet  Commonly known as:  PRINIVIL,ZESTRIL  Take 10 mg by mouth daily.     methocarbamol 500 MG tablet  Commonly known as:  ROBAXIN  Take 1 tablet (500 mg total) by mouth 3 (three) times daily as needed for muscle spasms.     ondansetron 4 MG tablet  Commonly known as:  ZOFRAN  Take 1 tablet (4 mg total) by mouth every 8 (eight) hours as needed for nausea or vomiting.     oxyCODONE-acetaminophen 10-325 MG per tablet  Commonly known as:  PERCOCET  Take 1 tablet by mouth every 4 (four) hours as needed for pain.        Diagnostic Studies: Dg Lumbar Spine 2-3 Views  10/27/2014   CLINICAL DATA:  Status post  spinal fusion  EXAM: LUMBAR SPINE - 2-3 VIEW  COMPARISON:  10/26/2014  FINDINGS: Stable grade 1 anterior listhesis of L4 on L5 with posterior fusion at this level using transpedicular screws and connecting rods. Interbody spacer noted.  IMPRESSION: Postoperative appearance as described   Electronically Signed   By: Esperanza Heir M.D.   On: 10/27/2014 11:27   Dg Lumbar Spine 2-3 Views  10/26/2014   CLINICAL DATA:  Status post posterior fusion of L4-5.  EXAM: DG C-ARM 61-120 MIN; LUMBAR SPINE - 2-3 VIEW  TECHNIQUE: Two intraoperative fluoroscopic images of lower lumbar spine were obtained.  CONTRAST:  None.  FLUOROSCOPY TIME:  Radiation Exposure Index (as provided by the fluoroscopic device): Not given.  If the device does not provide the exposure index:  Fluoroscopy Time (in minutes and seconds):  1 minutes 7 seconds.  Number of Acquired Images:  2  COMPARISON:  September 14, 2014.  FINDINGS: These images demonstrate surgical posterior fusion of L4-5 with bilateral intrapedicular screw placement and interbody fusion.  IMPRESSION: Status post surgical posterior fusion of L4-5.   Electronically Signed   By: Lupita Raider, M.D.   On: 10/26/2014 15:29   Dg C-arm 61-120 Min  10/26/2014   CLINICAL DATA:  Status post posterior fusion of L4-5.  EXAM: DG C-ARM 61-120 MIN; LUMBAR SPINE - 2-3 VIEW  TECHNIQUE: Two intraoperative fluoroscopic images of lower lumbar spine were obtained.  CONTRAST:  None.  FLUOROSCOPY TIME:  Radiation Exposure Index (as provided by the fluoroscopic device): Not given.  If the device does not provide the exposure index:  Fluoroscopy Time (in minutes and seconds):  1 minutes 7 seconds.  Number of Acquired Images:  2  COMPARISON:  September 14, 2014.  FINDINGS: These images demonstrate surgical posterior fusion of L4-5 with bilateral intrapedicular screw placement and interbody fusion.  IMPRESSION: Status post surgical posterior fusion of L4-5.   Electronically Signed   By: Lupita Raider,  M.D.   On: 10/26/2014 15:29        Discharge Instructions    Call MD / Call 911    Complete by:  As directed   If you experience chest pain or shortness of breath, CALL 911 and be transported to the hospital emergency room.  If you develope a fever above 101 F, pus (white drainage) or increased drainage or redness at the wound, or calf pain, call your surgeon's office.     Constipation Prevention    Complete by:  As directed   Drink plenty of fluids.  Prune juice may be helpful.  You may use a stool softener, such as Colace (over the counter) 100 mg twice a day.  Use MiraLax (over the  counter) for constipation as needed.     Diet - low sodium heart healthy    Complete by:  As directed      Increase activity slowly as tolerated    Complete by:  As directed            Follow-up Information    Follow up with Alvy Beal, MD. Schedule an appointment as soon as possible for a visit in 2 weeks.   Specialty:  Orthopedic Surgery   Why:  For suture removal, For wound re-check   Contact information:   [address] [place] [address] [phone]       Discharge Plan:  discharge to home  Disposition: doing well.  HCT remains low but no clinical symptoms.  Patient is alert, ambulating, stable urine output, stable vitals.  Recommend f/u with PCP and will continue FeSO4.    Signed: Venita Lick D for Dr. Venita Lick [facility] [phone] 11/03/2014, 7:29 AM

## 2020-03-28 ENCOUNTER — Encounter (INDEPENDENT_AMBULATORY_CARE_PROVIDER_SITE_OTHER): Payer: Self-pay | Admitting: Otolaryngology

## 2020-03-28 ENCOUNTER — Other Ambulatory Visit: Payer: Self-pay

## 2020-03-28 ENCOUNTER — Ambulatory Visit (INDEPENDENT_AMBULATORY_CARE_PROVIDER_SITE_OTHER): Payer: PRIVATE HEALTH INSURANCE | Admitting: Otolaryngology

## 2020-03-28 VITALS — Temp 97.2°F

## 2020-03-28 DIAGNOSIS — K219 Gastro-esophageal reflux disease without esophagitis: Secondary | ICD-10-CM

## 2020-03-28 DIAGNOSIS — R490 Dysphonia: Secondary | ICD-10-CM

## 2020-03-28 NOTE — Progress Notes (Signed)
HPI: Abigail Lawrence is a 64 y.o. female who presents for evaluation of hoarseness that she has had for about 8 months.  She states that the more she talks the worse her voice gets.  She describes a raspiness to her voice although on conversation in the office today she has minimal hoarseness.  She has had intermittent cough but this is doing better since stopping lisinopril.  She occasionally strangles on liquids but does not have any trouble eating foods. She does not smoke. She has had history of insulin-dependent diabetes for over 30 years.  Past Medical History:  Diagnosis Date  . Arthritis   . Depression   . Diabetes mellitus without complication (HCC)   . Fibromyalgia    Past Surgical History:  Procedure Laterality Date  . EYE SURGERY    . TUBAL LIGATION     Social History   Socioeconomic History  . Marital status: Married    Spouse name: Not on file  . Number of children: Not on file  . Years of education: Not on file  . Highest education level: Not on file  Occupational History  . Not on file  Tobacco Use  . Smoking status: Never Smoker  . Smokeless tobacco: Never Used  Substance and Sexual Activity  . Alcohol use: No  . Drug use: No  . Sexual activity: Not on file  Other Topics Concern  . Not on file  Social History Narrative  . Not on file   Social Determinants of Health   Financial Resource Strain:   . Difficulty of Paying Living Expenses:   Food Insecurity:   . Worried About Programme researcher, broadcasting/film/video in the Last Year:   . Barista in the Last Year:   Transportation Needs:   . Freight forwarder (Medical):   Marland Kitchen Lack of Transportation (Non-Medical):   Physical Activity:   . Days of Exercise per Week:   . Minutes of Exercise per Session:   Stress:   . Feeling of Stress :   Social Connections:   . Frequency of Communication with Friends and Family:   . Frequency of Social Gatherings with Friends and Family:   . Attends Religious Services:   . Active  Member of Clubs or Organizations:   . Attends Banker Meetings:   Marland Kitchen Marital Status:    No family history on file. Allergies  Allergen Reactions  . Codeine Other (See Comments)    headache  . Sulfa Antibiotics Swelling and Rash   Prior to Admission medications   Medication Sig Start Date End Date Taking? Authorizing Provider  escitalopram (LEXAPRO) 10 MG tablet Take 10 mg by mouth daily. 10/16/14  Yes [provider]  gabapentin (NEURONTIN) 100 MG capsule Take 100 mg by mouth 3 (three) times daily. 09/20/14  Yes [provider]  HUMALOG 100 UNIT/ML injection Inject 0-15 Units into the skin 3 (three) times daily. Per sliding scale 08/10/14  Yes [provider]  docusate sodium (COLACE) 100 MG capsule Take 1 capsule (100 mg total) by mouth 3 (three) times daily as needed for mild constipation. Patient not taking: Reported on 03/28/2020 10/26/14   Venita Lick, MD  ferrous sulfate 325 (65 FE) MG tablet Take 1 tablet (325 mg total) by mouth 3 (three) times daily with meals. Patient not taking: Reported on 03/28/2020 10/29/14   Venita Lick, MD  LEVEMIR 100 UNIT/ML injection Inject 10 Units into the skin 2 (two) times daily. Patient not taking: Reported  on 03/28/2020 10/06/14   [provider]  lisinopril (PRINIVIL,ZESTRIL) 10 MG tablet Take 10 mg by mouth daily. Patient not taking: Reported on 03/28/2020 10/16/14   [provider]  methocarbamol (ROBAXIN) 500 MG tablet Take 1 tablet (500 mg total) by mouth 3 (three) times daily as needed for muscle spasms. Patient not taking: Reported on 03/28/2020 10/26/14   Venita Lick, MD  ondansetron (ZOFRAN) 4 MG tablet Take 1 tablet (4 mg total) by mouth every 8 (eight) hours as needed for nausea or vomiting. Patient not taking: Reported on 03/28/2020 10/26/14   Venita Lick, MD  oxyCODONE-acetaminophen (PERCOCET) 10-325 MG per tablet Take 1 tablet by mouth every 4 (four) hours as needed for  pain. Patient not taking: Reported on 03/28/2020 10/26/14   Venita Lick, MD     Positive ROS: Otherwise negative  All other systems have been reviewed and were otherwise negative with the exception of those mentioned in the HPI and as above.  Physical Exam: Constitutional: Alert, well-appearing, no acute distress.  No obvious hoarseness on conversation in the office today Ears: External ears without lesions or tenderness. Ear canals are clear bilaterally with intact, clear TMs.  Nasal: External nose without lesions. Septum midline with mild rhinitis.. Clear nasal passages with no signs of infection. Oral: Lips and gums without lesions. Tongue and palate mucosa without lesions. Posterior oropharynx clear. Fiberoptic laryngoscopy was performed to the right nostril.  The nasopharynx was clear.  The base of tongue vallecula epiglottis were normal.  On evaluation the vocal cords she had normal vocal cord bilaterally with no polyps nodules or edema noted.  She has slight mucus that was easily cleared with coughing.  Vocal cords had symmetric mobility.  Arytenoids were clear with minimal edema.  Piriform sinuses were clear. Neck: No palpable adenopathy or masses Respiratory: Breathing comfortably  Skin: No facial/neck lesions or rash noted.  Laryngoscopy  Date/Time: 03/28/2020 5:53 PM Performed by: Drema Halon, MD Authorized by: Drema Halon, MD   Consent:    Consent obtained:  Verbal   Consent given by:  Patient Procedure details:    Indications: hoarseness, dysphagia, or aspiration     Medication:  Afrin   Instrument: flexible fiberoptic laryngoscope     Scope location: right nare   Sinus:    Right nasopharynx: normal   Mouth:    Oropharynx: normal     Vallecula: normal     Base of tongue: normal     Epiglottis: normal   Throat:    Pyriform sinus: normal     True vocal cords: normal   Comments:     On fiberoptic laryngoscopy vocal cords were clear bilaterally  with normal vocal cord mobility.  She has slight mucus that was easily cleared with coughing    Assessment: History of hoarseness with clear vocal cord examination on fiberoptic laryngoscopy.  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  Plan: Placed on omeprazole 40 mg daily before dinner for the next month.  If hoarseness does not improve consider further referral and evaluation with speech therapy.  Narda Bonds, MD

## 2021-08-06 ENCOUNTER — Ambulatory Visit: Payer: Self-pay | Admitting: Orthopedic Surgery

## 2021-08-06 NOTE — H&P (Signed)
Subjective:   PMH: DM. Lat A1C>8, previous lumbar fusion L4-5 CC: right leg pain XLIF L3-4 CONE 08/15/21  Patient Active Problem List   Diagnosis Date Noted   Back pain 10/26/2014   Past Medical History:  Diagnosis Date   Arthritis    Depression    Diabetes mellitus without complication (HCC)    Fibromyalgia     Past Surgical History:  Procedure Laterality Date   EYE SURGERY     TUBAL LIGATION      Current Outpatient Medications  Medication Sig Dispense Refill Last Dose   acetaminophen (TYLENOL) 500 MG tablet Take 1,000 mg by mouth every 6 (six) hours as needed for moderate pain.      amitriptyline (ELAVIL) 10 MG tablet Take 10 mg by mouth at bedtime.      docusate sodium (COLACE) 100 MG capsule Take 1 capsule (100 mg total) by mouth 3 (three) times daily as needed for mild constipation. 30 capsule 0    escitalopram (LEXAPRO) 10 MG tablet Take 10 mg by mouth daily.  11    gabapentin (NEURONTIN) 300 MG capsule Take 300 mg by mouth 4 (four) times daily as needed (pain).      HUMALOG 100 UNIT/ML injection Inject 2-10 Units into the skin 4 (four) times daily -  with meals and at bedtime. Per sliding scale  6    HYDROcodone-acetaminophen (NORCO/VICODIN) 5-325 MG tablet Take 1 tablet by mouth every 4 (four) hours as needed for moderate pain.      insulin glargine, 1 Unit Dial, (TOUJEO SOLOSTAR) 300 UNIT/ML Solostar Pen Inject 18 Units into the skin at bedtime.      losartan (COZAAR) 25 MG tablet Take 25 mg by mouth daily.      meloxicam (MOBIC) 15 MG tablet Take 15 mg by mouth daily as needed for pain.      Menthol, Topical Analgesic, (BENGAY EX) Apply 1 application topically daily as needed (back pain).      methocarbamol (ROBAXIN) 500 MG tablet Take 1 tablet (500 mg total) by mouth 3 (three) times daily as needed for muscle spasms. 60 tablet 0    Polyethyl Glycol-Propyl Glycol (SYSTANE OP) Place 1 drop into both eyes daily as needed (dry eyes).      rOPINIRole (REQUIP) 0.25 MG  tablet Take 0.125-0.25 mg by mouth at bedtime as needed (restless legs).      No current facility-administered medications for this visit.   Allergies  Allergen Reactions   Codeine Other (See Comments)    headache   Sulfa Antibiotics Swelling and Rash    Social History   Tobacco Use   Smoking status: Never   Smokeless tobacco: Never  Substance Use Topics   Alcohol use: No    No family history on file.  Review of Systems Pertinent items are noted in HPI.  Objective:   Vitals: Ht: 5 ft 08/06/2021 11:07 am BP: 120/70 08/06/2021 11:08 am Pulse: 86 bpm 08/06/2021 11:08 am  General: AAOx3, NAD Heart: RRR, no rubs, murmers, or gallops Lungs: CTAB Abdomen: Bsx4, non-distended, non-tender, no rebound tenderness, no loss of bladder or bowel control.  Abigail Lawrence returns for follow-up of her MRI. Unfortunately her quality-of-life his continue to deteriorate. While she still maintains her self directed therapy program her overall quality-of-life is quite poor. She has progressive right L3 radicular pain. 5/5 motor strength in the lower extremity. Negative Babinski test. Lumbar MRI: completed on 07/09/21 was reviewed with the patient. It was completed at [facility]; I have independently  reviewed the images as well as the radiology report. No abnormal marrow signal changes. Previous decompression and fusion at L4-5 without central stenosis or foraminal stenosis. No neural encroachment or stenosis at L5-S1, L1/2, or L2-3. L3-4: Right L4-5 facet cyst causing lateral recess and foraminal stenosis. Hard disc osteophyte further compressing the lateral recess and foramen. Grade 1 degenerative spondylolisthesis at L3-4 contributing to moderate central stenosis. X-rays of the lumbar spine from 06/26/21 were compared to those from 2016. It shows the progression of the degenerative collapse at L3-4 with the development of the spondylolisthesis.  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 She has a posterior hard disc osteophyte along with a right facet cyst that is contributing to neurologic deficits in the L3 dermatome. She has had physical therapy as well as injection therapy and unfortunately her quality-of-life has only deteriorated. At this point she would like to move forward with surgery which I think is reasonable.  Plan:    The surgical plan would be to do a lateral interbody fusion at L3-4 to address the spondylolisthesis and degenerative disc disease. By restoring the disc height in the alignment this may also provide indirect neural decompression thereby alleviating the L3 nerve compression. To provide additional stability I would apply a lateral plate. We did discuss that if the radicular leg pain persisted then we may need to consider a posterior-based facetectomy and decompression. However I am very optimistic that the lateral interbody fusion will alleviate the back buttock and neuropathic leg pain. I have gone over the surgical procedure in great detail with the patient and her husband including the risks, benefits and alternatives. All their questions were encouraged and addressed. OLIF/XLIF risks, benefits of surgery were reviewed with the patient. These include: infection, bleeding, death, stroke, paralysis, ongoing or worse pain, need for additional surgery, injury to the lumbar plexus resulting in hip flexor weakness and difficulty walking without assistive devices. Adjacent segment degenerative disease, need for additional surgery including fusing other levels, leak of spinal fluid, Nonunion, hardware failure, breakage, or mal-position. Deep venous thrombosis (DVT) requiring additional treatment such as filter, and/or medications. Injury to abdominal contents, loss in bowel and bladder control.  We have  recieved pre-op medical clearance from PCP. Unfortunately her last A1C was >8 and she had to cancel her upcoming endocrinologist appointment because it was scheduled for the day of her surgery. She notes her blood sugars have been high lately and she attributes this to her increased pain.  I did discuss with the patient that having poorly controlled diabetes increases her surgical risk including infection, wound healing complications, and decreased chance of fusion. I discussed the elevated A1c with Dr. Shon Baton. He is in agreement to move forward with the planned procedure despite the elevated A1c as long as the patient understands her increased risk. She has expressed understanding of this. In the meantime, she will contact her endocrinologist office and see if she is able to see them prior to surgery. I will also add on an updated A1c to her preop labs.  I reviewed the patient's medication list with her. She is not on any blood thinners. Not using aspirin. I have advised her to stop her meloxicam and avoid any over-the-counter NSAIDs. She expressed understanding of this. She also takes vitamin D which have advised her to discontinue prior to surgery as  well.  She is scheduled for LSO brace fitting with PT following this visit.  We have also discussed the post-operative recovery period to include: bathing/showering restrictions, wound healing, activity (and driving) restrictions, medications/pain mangement.  We have also discussed post-operative redflags to include: signs and symptoms of postoperative infection, DVT/PE.  Discharge instructions were reviewed with the patient and she was given a copy of her discharge instructions today.  All patients questions were invited and answered.  Follow-up: 2 weeks postop

## 2021-08-07 ENCOUNTER — Ambulatory Visit: Payer: Self-pay | Admitting: Orthopedic Surgery

## 2021-08-07 DIAGNOSIS — Z01818 Encounter for other preprocedural examination: Secondary | ICD-10-CM

## 2021-08-08 NOTE — Pre-Procedure Instructions (Addendum)
Surgical Instructions    Your procedure is scheduled on Wednesday 08/15/21.  Report to [facility] Main Entrance "A" at 09:00 A.M., then check in with the Admitting office.  Call this number if you have problems the morning of surgery:  [phone]   If you have any questions prior to your surgery date call [phone]: Open Monday-Friday 8am-4pm    Remember:  Do not eat after midnight the night before your surgery  You may drink clear liquids until 08:00 A.M. the morning of your surgery.   Clear liquids allowed are: Water, Non-Citrus Juices (without pulp), Carbonated Beverages, Clear Tea, Black Coffee ONLY (NO MILK, CREAM OR POWDERED CREAMER of any kind), and Gatorade    Take these medicines the morning of surgery with A SIP OF WATER:  escitalopram (LEXAPRO)    Take these medicines the morning of surgery with A SIP OF WATER AS NEEDED: acetaminophen (TYLENOL)  docusate sodium (COLACE)  gabapentin (NEURONTIN) HYDROcodone-acetaminophen (NORCO/VICODIN)  methocarbamol (ROBAXIN)  Polyethyl Glycol-Propyl Glycol (SYSTANE OP)     As of today, STOP taking any meloxicam (MOBIC), Aspirin (unless otherwise instructed by your surgeon) Aleve, Naproxen, Ibuprofen, Motrin, Advil, Goody's, BC's, all herbal medications, fish oil, and all vitamins.   WHAT DO I DO ABOUT MY DIABETES MEDICATION?   Do not take oral diabetes medicines (pills) the morning of surgery.  HUMALOG 100 UNIT/ML injection 08/14/21 Before Meals: Take usual dose No bedtime dose 08/15/21 NONE insulin glargine, 1 Unit Dial, (TOUJEO SOLOSTAR)-taken at bedtime For Type 1 Diabetics: 08/14/21 Take 80% of dose   The day of surgery, do not take other diabetes injectables, including Byetta (exenatide), Bydureon (exenatide ER), Victoza (liraglutide), or Trulicity (dulaglutide).  If your CBG is greater than 220 mg/dL, you may take  of your sliding scale (correction) dose of HUMALOG 100 UNIT/ML injection.   HOW TO MANAGE  YOUR DIABETES BEFORE AND AFTER SURGERY  Why is it important to control my blood sugar before and after surgery? Improving blood sugar levels before and after surgery helps healing and can limit problems. A way of improving blood sugar control is eating a healthy diet by:  Eating less sugar and carbohydrates  Increasing activity/exercise  Talking with your doctor about reaching your blood sugar goals High blood sugars (greater than 180 mg/dL) can raise your risk of infections and slow your recovery, so you will need to focus on controlling your diabetes during the weeks before surgery. Make sure that the doctor who takes care of your diabetes knows about your planned surgery including the date and location.  How do I manage my blood sugar before surgery? Check your blood sugar at least 4 times a day, starting 2 days before surgery, to make sure that the level is not too high or low.  Check your blood sugar the morning of your surgery when you wake up and every 2 hours until you get to the Short Stay unit.  If your blood sugar is less than 70 mg/dL, you will need to treat for low blood sugar: Do not take insulin. Treat a low blood sugar (less than 70 mg/dL) with  cup of clear juice (cranberry or apple), 4 glucose tablets, OR glucose gel. Recheck blood sugar in 15 minutes after treatment (to make sure it is greater than 70 mg/dL). If your blood sugar is not greater than 70 mg/dL on recheck, call [phone] for further instructions. Report your blood sugar to the short stay nurse when you get to Short Stay.  If you  are admitted to the hospital after surgery: Your blood sugar will be checked by the staff and you will probably be given insulin after surgery (instead of oral diabetes medicines) to make sure you have good blood sugar levels. The goal for blood sugar control after surgery is 80-180 mg/dL.   After your COVID test   You are not required to quarantine however you are required to  wear a well-fitting mask when you are out and around people not in your household.  If your mask becomes wet or soiled, replace with a new one.  Wash your hands often with soap and water for 20 seconds or clean your hands with an alcohol-based hand sanitizer that contains at least 60% alcohol.  Do not share personal items.  Notify your provider: if you are in close contact with someone who has COVID  or if you develop a fever of 100.4 or greater, sneezing, cough, sore throat, shortness of breath or body aches.         Do not wear jewelry or makeup Do not wear lotions, powders, perfumes, or deodorant. Do not shave 48 hours prior to surgery.  Do not bring valuables to the hospital. DO Not wear nail polish, gel polish, artificial nails, or any other type of covering on natural nails including finger and toenails. If patients have artificial nails, gel coating, etc. that need to be removed by a nail salon, please have this removed prior to surgery or surgery may need to be canceled/delayed if the surgeon/ anesthesia feels like the patient is unable to be adequately monitored.             [facility] is not responsible for any belongings or valuables.  Do NOT Smoke (Tobacco/Vaping)  24 hours prior to your procedure  If you use a CPAP at night, you may bring your mask for your overnight stay.   Contacts, glasses, hearing aids, dentures or partials may not be worn into surgery, please bring cases for these belongings   For patients admitted to the hospital, discharge time will be determined by your treatment team.   Patients discharged the day of surgery will not be allowed to drive home, and someone needs to stay with them for 24 hours.  NO VISITORS WILL BE ALLOWED IN PRE-OP WHERE PATIENTS ARE PREPPED FOR SURGERY.  ONLY 1 SUPPORT PERSON MAY BE PRESENT IN THE WAITING ROOM WHILE YOU ARE IN SURGERY.  IF YOU ARE TO BE ADMITTED, ONCE YOU ARE IN YOUR ROOM YOU WILL BE ALLOWED TWO (2) VISITORS. 1  (ONE) VISITOR MAY STAY OVERNIGHT BUT MUST ARRIVE TO THE ROOM BY 8pm.  Minor children may have two parents present. Special consideration for safety and communication needs will be reviewed on a case by case basis.  Special instructions:    Oral Hygiene is also important to reduce your risk of infection.  Remember - BRUSH YOUR TEETH THE MORNING OF SURGERY WITH YOUR REGULAR TOOTHPASTE   [place]- Preparing For Surgery  Before surgery, you can play an important role. Because skin is not sterile, your skin needs to be as free of germs as possible. You can reduce the number of germs on your skin by washing with CHG (chlorahexidine gluconate) Soap before surgery.  CHG is an antiseptic cleaner which kills germs and bonds with the skin to continue killing germs even after washing.     Please do not use if you have an allergy to CHG or antibacterial soaps. If your skin  becomes reddened/irritated stop using the CHG.  Do not shave (including legs and underarms) for at least 48 hours prior to first CHG shower. It is OK to shave your face.  Please follow these instructions carefully.     Shower the NIGHT BEFORE SURGERY and the MORNING OF SURGERY with CHG Soap.   If you chose to wash your hair, wash your hair first as usual with your normal shampoo. After you shampoo, rinse your hair and body thoroughly to remove the shampoo.  Then [employer] and genitals (private parts) with your normal soap and rinse thoroughly to remove soap.  After that Use CHG Soap as you would any other liquid soap. You can apply CHG directly to the skin and wash gently with a scrungie or a clean washcloth.   Apply the CHG Soap to your body ONLY FROM THE NECK DOWN.  Do not use on open wounds or open sores. Avoid contact with your eyes, ears, mouth and genitals (private parts). Wash Face and genitals (private parts)  with your normal soap.   Wash thoroughly, paying special attention to the area where your surgery will be  performed.  Thoroughly rinse your body with warm water from the neck down.  DO NOT shower/wash with your normal soap after using and rinsing off the CHG Soap.  Pat yourself dry with a CLEAN TOWEL.  Wear CLEAN PAJAMAS to bed the night before surgery  Place CLEAN SHEETS on your bed the night before your surgery  DO NOT SLEEP WITH PETS.   Day of Surgery:  Take a shower with CHG soap. Wear Clean/Comfortable clothing the morning of surgery Do not apply any deodorants/lotions.   Remember to brush your teeth WITH YOUR REGULAR TOOTHPASTE.   Please read over the following fact sheets that you were given.

## 2021-08-13 ENCOUNTER — Encounter (HOSPITAL_COMMUNITY): Payer: Self-pay

## 2021-08-13 ENCOUNTER — Other Ambulatory Visit: Payer: Self-pay

## 2021-08-13 ENCOUNTER — Encounter (HOSPITAL_COMMUNITY)
Admission: RE | Admit: 2021-08-13 | Discharge: 2021-08-13 | Disposition: A | Discharge status: Home / Self Care | Payer: No Typology Code available for payment source | Source: Ambulatory Visit | Attending: Orthopedic Surgery | Admitting: Orthopedic Surgery

## 2021-08-13 VITALS — BP 140/53 | HR 69 | Temp 98.4°F | Resp 18 | Ht 62.0 in | Wt 146.4 lb

## 2021-08-13 DIAGNOSIS — Z01818 Encounter for other preprocedural examination: Secondary | ICD-10-CM

## 2021-08-13 DIAGNOSIS — Z20822 Contact with and (suspected) exposure to covid-19: Secondary | ICD-10-CM | POA: Insufficient documentation

## 2021-08-13 DIAGNOSIS — Z01812 Encounter for preprocedural laboratory examination: Secondary | ICD-10-CM | POA: Insufficient documentation

## 2021-08-13 DIAGNOSIS — E10649 Type 1 diabetes mellitus with hypoglycemia without coma: Secondary | ICD-10-CM | POA: Insufficient documentation

## 2021-08-13 LAB — CBC
HCT: 34.7 % — ABNORMAL LOW (ref 36.0–46.0)
Hemoglobin: 11.3 g/dL — ABNORMAL LOW (ref 12.0–15.0)
MCH: 30.6 pg (ref 26.0–34.0)
MCHC: 32.6 g/dL (ref 30.0–36.0)
MCV: 94 fL (ref 80.0–100.0)
Platelets: 222 10*3/uL (ref 150–400)
RBC: 3.69 MIL/uL — ABNORMAL LOW (ref 3.87–5.11)
RDW: 13.6 % (ref 11.5–15.5)
WBC: 3.3 10*3/uL — ABNORMAL LOW (ref 4.0–10.5)
nRBC: 0 % (ref 0.0–0.2)

## 2021-08-13 LAB — TYPE AND SCREEN
ABO/RH(D): A POS
Antibody Screen: NEGATIVE

## 2021-08-13 LAB — SURGICAL PCR SCREEN
MRSA, PCR: NEGATIVE
Staphylococcus aureus: NEGATIVE

## 2021-08-13 LAB — URINALYSIS, ROUTINE W REFLEX MICROSCOPIC
Bilirubin Urine: NEGATIVE
Glucose, UA: NEGATIVE mg/dL
Hgb urine dipstick: NEGATIVE
Ketones, ur: NEGATIVE mg/dL
Leukocytes,Ua: NEGATIVE
Nitrite: NEGATIVE
Protein, ur: NEGATIVE mg/dL
Specific Gravity, Urine: 1.012 (ref 1.005–1.030)
pH: 6 (ref 5.0–8.0)

## 2021-08-13 LAB — APTT: aPTT: 30 seconds (ref 24–36)

## 2021-08-13 LAB — BASIC METABOLIC PANEL
Anion gap: 8 (ref 5–15)
BUN: 15 mg/dL (ref 8–23)
CO2: 30 mmol/L (ref 22–32)
Calcium: 9.1 mg/dL (ref 8.9–10.3)
Chloride: 98 mmol/L (ref 98–111)
Creatinine, Ser: 0.65 mg/dL (ref 0.44–1.00)
GFR, Estimated: >60 mL/min (ref >60)
Glucose, Bld: 121 mg/dL — ABNORMAL HIGH (ref 70–99)
Potassium: 3.9 mmol/L (ref 3.5–5.1)
Sodium: 136 mmol/L (ref 135–145)

## 2021-08-13 LAB — GLUCOSE, CAPILLARY: Glucose-Capillary: 121 mg/dL — ABNORMAL HIGH (ref 70–99)

## 2021-08-13 LAB — PROTIME-INR
INR: 1 (ref 0.8–1.2)
Prothrombin Time: 12.9 seconds (ref 11.4–15.2)

## 2021-08-13 NOTE — Progress Notes (Signed)
PCP - Selina Cooley, MD Cardiologist - denies  PPM/ICD - denies Device Orders - n/a Rep Notified - n/a  Chest x-ray - 07/26/2021 EKG - 07/26/2021 (CE) - records requested Stress Test - denies ECHO - denies Cardiac Cath - denies  Sleep Study - denies CPAP - n/a  Fasting Blood Sugar - 80 - 200 Checks Blood Sugar four times a day CBG today - 121 A1C - done in PAT on 08/13/2021  Blood Thinner Instructions: n/a  Aspirin Instructions: Patient was instructed: As of today, STOP taking any meloxicam (MOBIC), Aspirin (unless otherwise instructed by your surgeon) Aleve, Naproxen, Ibuprofen, Motrin, Advil, Goody's, BC's, all herbal medications, fish oil, and all vitamins.  ERAS Protcol - yes PRE-SURGERY Ensure or G2- n/a  COVID TEST- done in PAT on 08/13/2021   Anesthesia review: yes - MD order; medical clearance on 07/26/21; EKG tracing requested  Patient denies shortness of breath, fever, cough and chest pain at PAT appointment   All instructions explained to the patient, with a verbal understanding of the material. Patient agrees to go over the instructions while at home for a better understanding. Patient also instructed to self quarantine after being tested for COVID-19. The opportunity to ask questions was provided.

## 2021-08-14 LAB — HEMOGLOBIN A1C
Hgb A1c MFr Bld: 8.8 % — ABNORMAL HIGH (ref 4.8–5.6)
Mean Plasma Glucose: 206 mg/dL

## 2021-08-14 LAB — SARS CORONAVIRUS 2 (TAT 6-24 HRS): SARS Coronavirus 2: NEGATIVE

## 2021-08-14 NOTE — Anesthesia Preprocedure Evaluation (Addendum)
Anesthesia Evaluation  Patient identified by MRN, date of birth, ID band Patient awake    Reviewed: Allergy & Precautions, NPO status , Patient's Chart, lab work & pertinent test results  Airway Mallampati: III  TM Distance: >3 FB Neck ROM: Full    Dental no notable dental hx. (+) Dental Advisory Given, Teeth Intact   Pulmonary neg pulmonary ROS,    Pulmonary exam normal breath sounds clear to auscultation       Cardiovascular Normal cardiovascular exam Rhythm:Regular Rate:Normal  EKG: known diffuse ST depression   Neuro/Psych PSYCHIATRIC DISORDERS Depression negative neurological ROS     GI/Hepatic negative GI ROS, Neg liver ROS,   Endo/Other  diabetes, Well Controlled, Type 2, Insulin Dependenta1c 8.8 FS 113 this AM Feels symptomatic around 50  Renal/GU negative Renal ROS  negative genitourinary   Musculoskeletal  (+) Arthritis , Osteoarthritis,  Fibromyalgia -DDD L3-4   Abdominal   Peds  Hematology  (+) Blood dyscrasia, anemia , hct 34.7   Anesthesia Other Findings norco 5mg  for last two weeks 2-3x/d  Reproductive/Obstetrics negative OB ROS                          Anesthesia Physical Anesthesia Plan  ASA: 3  Anesthesia Plan: General   Post-op Pain Management: Tylenol PO (pre-op)   Induction: Intravenous  PONV Risk Score and Plan: 3 and Ondansetron, Dexamethasone, Midazolam and Treatment may vary due to age or medical condition  Airway Management Planned: Oral ETT  Additional Equipment: None  Intra-op Plan:   Post-operative Plan: Extubation in OR  Informed Consent: I have reviewed the patients History and Physical, chart, labs and discussed the procedure including the risks, benefits and alternatives for the proposed anesthesia with the patient or authorized representative who has indicated his/her understanding and acceptance.     Dental advisory given  Plan Discussed  with: CRNA  Anesthesia Plan Comments: (neuromonitoring  Pt refusing TAP block)      Anesthesia Quick Evaluation

## 2021-08-14 NOTE — Progress Notes (Signed)
Anesthesia Chart Review:  Uncontrolled type 1 diabetes, A1c 8.8 on preop labs.  Managed by endocrinology at [facility].  Seen by PCP Dr. Selina Cooley 07/26/2021 for preop clearance.  Per note, "Labs are normal other than some transient hypoglycemia noted at time of blood draw. Labs and EKG and CXR are normal / stable. Patient is cleared to proceed with surgery as indicated."  Mild anemia on preop labs with hemoglobin 11.3, otherwise unremarkable.  EKG 07/26/2021 (copy on chart): Sinus rhythm.  Rate 74.  Poor R wave progression.  Diffuse ST depression, similar to prior tracing.   Zannie Cove [facility] Short Stay Center/Anesthesiology Phone [phone] 08/14/2021 10:30 AM

## 2021-08-15 ENCOUNTER — Inpatient Hospital Stay (HOSPITAL_COMMUNITY): Payer: No Typology Code available for payment source | Admitting: Physician Assistant

## 2021-08-15 ENCOUNTER — Inpatient Hospital Stay (HOSPITAL_COMMUNITY): Payer: No Typology Code available for payment source | Admitting: Anesthesiology

## 2021-08-15 ENCOUNTER — Encounter (HOSPITAL_COMMUNITY)
Admission: RE | Disposition: A | Discharge status: Home / Self Care | Payer: Self-pay | Source: Home / Self Care | Attending: Orthopedic Surgery

## 2021-08-15 ENCOUNTER — Inpatient Hospital Stay (HOSPITAL_COMMUNITY)
Admission: RE | Admit: 2021-08-15 | Discharge: 2021-08-16 | DRG: 455 | Disposition: A | Discharge status: Home / Self Care | Payer: No Typology Code available for payment source | Attending: Orthopedic Surgery | Admitting: Orthopedic Surgery

## 2021-08-15 ENCOUNTER — Inpatient Hospital Stay (HOSPITAL_COMMUNITY): Payer: No Typology Code available for payment source

## 2021-08-15 ENCOUNTER — Other Ambulatory Visit: Payer: Self-pay

## 2021-08-15 ENCOUNTER — Encounter (HOSPITAL_COMMUNITY): Payer: Self-pay | Admitting: Orthopedic Surgery

## 2021-08-15 DIAGNOSIS — E119 Type 2 diabetes mellitus without complications: Secondary | ICD-10-CM | POA: Diagnosis present

## 2021-08-15 DIAGNOSIS — Z20822 Contact with and (suspected) exposure to covid-19: Secondary | ICD-10-CM | POA: Diagnosis present

## 2021-08-15 DIAGNOSIS — M4316 Spondylolisthesis, lumbar region: Secondary | ICD-10-CM | POA: Diagnosis present

## 2021-08-15 DIAGNOSIS — M797 Fibromyalgia: Secondary | ICD-10-CM | POA: Diagnosis present

## 2021-08-15 DIAGNOSIS — Z981 Arthrodesis status: Secondary | ICD-10-CM

## 2021-08-15 DIAGNOSIS — M5116 Intervertebral disc disorders with radiculopathy, lumbar region: Principal | ICD-10-CM | POA: Diagnosis present

## 2021-08-15 DIAGNOSIS — F32A Depression, unspecified: Secondary | ICD-10-CM | POA: Diagnosis present

## 2021-08-15 DIAGNOSIS — M7138 Other bursal cyst, other site: Secondary | ICD-10-CM | POA: Diagnosis present

## 2021-08-15 DIAGNOSIS — Z419 Encounter for procedure for purposes other than remedying health state, unspecified: Secondary | ICD-10-CM

## 2021-08-15 HISTORY — PX: ANTERIOR LATERAL LUMBAR FUSION WITH PERCUTANEOUS SCREW 1 LEVEL: SHX5553

## 2021-08-15 LAB — GLUCOSE, CAPILLARY
Glucose-Capillary: 113 mg/dL — ABNORMAL HIGH (ref 70–99)
Glucose-Capillary: 218 mg/dL — ABNORMAL HIGH (ref 70–99)
Glucose-Capillary: 251 mg/dL — ABNORMAL HIGH (ref 70–99)
Glucose-Capillary: 283 mg/dL — ABNORMAL HIGH (ref 70–99)

## 2021-08-15 SURGERY — ANTERIOR LATERAL LUMBAR FUSION WITH PERCUTANEOUS SCREW 1 LEVEL
Anesthesia: General

## 2021-08-15 MED ORDER — POLYETHYLENE GLYCOL 3350 17 G PO PACK: 17.0000 g | PACK | Freq: Every day | ORAL | Status: DC | PRN

## 2021-08-15 MED ORDER — SODIUM CHLORIDE 0.9 % IV SOLN: 250.0000 mL | INTRAVENOUS | Status: DC

## 2021-08-15 MED ORDER — MIDAZOLAM HCL 5 MG/5ML IJ SOLN
INTRAMUSCULAR | Status: DC | PRN
  Administered 2021-08-15: 2 mg via INTRAVENOUS

## 2021-08-15 MED ORDER — HYDROMORPHONE HCL 1 MG/ML IJ SOLN
0.5000 mg | INTRAMUSCULAR | Status: AC | PRN
  Administered 2021-08-15 (×3): 0.5 mg via INTRAVENOUS
  Filled 2021-08-15 (×3): qty 0.5

## 2021-08-15 MED ORDER — CEFAZOLIN SODIUM-DEXTROSE 2-4 GM/100ML-% IV SOLN
2.0000 g | INTRAVENOUS | Status: AC
  Administered 2021-08-15: 2 g via INTRAVENOUS
  Filled 2021-08-15: qty 100

## 2021-08-15 MED ORDER — SUCCINYLCHOLINE CHLORIDE 200 MG/10ML IV SOSY
PREFILLED_SYRINGE | INTRAVENOUS | Status: DC | PRN
  Administered 2021-08-15: 100 mg via INTRAVENOUS

## 2021-08-15 MED ORDER — LACTATED RINGERS IV SOLN: INTRAVENOUS | Status: DC | PRN

## 2021-08-15 MED ORDER — HYDROMORPHONE HCL 1 MG/ML IJ SOLN
0.2500 mg | INTRAMUSCULAR | Status: DC | PRN
Start: 2021-08-15 — End: 2021-08-15
  Administered 2021-08-15 (×4): 0.5 mg via INTRAVENOUS

## 2021-08-15 MED ORDER — MIDAZOLAM HCL 2 MG/2ML IJ SOLN
INTRAMUSCULAR | Status: AC
  Filled 2021-08-15: qty 2

## 2021-08-15 MED ORDER — HYDROMORPHONE HCL 1 MG/ML IJ SOLN
INTRAMUSCULAR | Status: AC
  Filled 2021-08-15: qty 1

## 2021-08-15 MED ORDER — BUPIVACAINE-EPINEPHRINE 0.25% -1:200000 IJ SOLN
INTRAMUSCULAR | Status: DC | PRN
  Administered 2021-08-15: 20 mL

## 2021-08-15 MED ORDER — LOSARTAN POTASSIUM 50 MG PO TABS
25.0000 mg | ORAL_TABLET | Freq: Every day | ORAL | Status: DC
  Administered 2021-08-15 – 2021-08-16 (×2): 25 mg via ORAL
  Filled 2021-08-15 (×2): qty 1

## 2021-08-15 MED ORDER — DOCUSATE SODIUM 100 MG PO CAPS
100.0000 mg | ORAL_CAPSULE | Freq: Two times a day (BID) | ORAL | Status: DC
  Administered 2021-08-15 – 2021-08-16 (×2): 100 mg via ORAL
  Filled 2021-08-15 (×2): qty 1

## 2021-08-15 MED ORDER — THROMBIN 20000 UNITS EX SOLR
CUTANEOUS | Status: AC
  Filled 2021-08-15: qty 20000

## 2021-08-15 MED ORDER — SODIUM CHLORIDE 0.9% FLUSH: 3.0000 mL | INTRAVENOUS | Status: DC | PRN

## 2021-08-15 MED ORDER — PHENOL 1.4 % MT LIQD: 1.0000 | OROMUCOSAL | Status: DC | PRN

## 2021-08-15 MED ORDER — INSULIN ASPART 100 UNIT/ML IJ SOLN
0.0000 [IU] | Freq: Every day | INTRAMUSCULAR | Status: DC
  Administered 2021-08-15: 3 [IU] via SUBCUTANEOUS

## 2021-08-15 MED ORDER — CEFAZOLIN SODIUM-DEXTROSE 1-4 GM/50ML-% IV SOLN
1.0000 g | Freq: Three times a day (TID) | INTRAVENOUS | Status: AC
  Administered 2021-08-15 – 2021-08-16 (×2): 1 g via INTRAVENOUS
  Filled 2021-08-15 (×2): qty 50

## 2021-08-15 MED ORDER — METHOCARBAMOL 500 MG PO TABS
500.0000 mg | ORAL_TABLET | Freq: Four times a day (QID) | ORAL | Status: DC | PRN
  Administered 2021-08-15 – 2021-08-16 (×4): 500 mg via ORAL
  Filled 2021-08-15 (×4): qty 1

## 2021-08-15 MED ORDER — OXYCODONE HCL 5 MG/5ML PO SOLN: 5.0000 mg | Freq: Once | ORAL | Status: DC | PRN

## 2021-08-15 MED ORDER — EPINEPHRINE PF 1 MG/ML IJ SOLN
INTRAMUSCULAR | Status: AC
  Filled 2021-08-15: qty 1

## 2021-08-15 MED ORDER — ONDANSETRON HCL 4 MG PO TABS: 4.0000 mg | ORAL_TABLET | Freq: Four times a day (QID) | ORAL | Status: DC | PRN

## 2021-08-15 MED ORDER — ORAL CARE MOUTH RINSE: 15.0000 mL | Freq: Once | OROMUCOSAL | Status: AC

## 2021-08-15 MED ORDER — BUPIVACAINE HCL (PF) 0.25 % IJ SOLN
INTRAMUSCULAR | Status: AC
  Filled 2021-08-15: qty 30

## 2021-08-15 MED ORDER — PROPOFOL 10 MG/ML IV BOLUS
INTRAVENOUS | Status: DC | PRN
  Administered 2021-08-15: 150 mg via INTRAVENOUS

## 2021-08-15 MED ORDER — ACETAMINOPHEN 650 MG RE SUPP: 650.0000 mg | RECTAL | Status: DC | PRN

## 2021-08-15 MED ORDER — ESCITALOPRAM OXALATE 10 MG PO TABS
10.0000 mg | ORAL_TABLET | Freq: Every day | ORAL | Status: DC
  Administered 2021-08-15 – 2021-08-16 (×2): 10 mg via ORAL
  Filled 2021-08-15 (×2): qty 1

## 2021-08-15 MED ORDER — ACETAMINOPHEN 325 MG PO TABS
650.0000 mg | ORAL_TABLET | ORAL | Status: DC | PRN
  Administered 2021-08-15 – 2021-08-16 (×3): 650 mg via ORAL
  Filled 2021-08-15 (×3): qty 2

## 2021-08-15 MED ORDER — SODIUM CHLORIDE 0.9% FLUSH
3.0000 mL | Freq: Two times a day (BID) | INTRAVENOUS | Status: DC
  Administered 2021-08-16: 3 mL via INTRAVENOUS

## 2021-08-15 MED ORDER — LIDOCAINE HCL (CARDIAC) PF 100 MG/5ML IV SOSY
PREFILLED_SYRINGE | INTRAVENOUS | Status: DC | PRN
  Administered 2021-08-15: 60 mg via INTRATRACHEAL

## 2021-08-15 MED ORDER — MENTHOL 3 MG MT LOZG: 1.0000 | LOZENGE | OROMUCOSAL | Status: DC | PRN

## 2021-08-15 MED ORDER — METHOCARBAMOL 1000 MG/10ML IJ SOLN
500.0000 mg | Freq: Four times a day (QID) | INTRAVENOUS | Status: DC | PRN
  Filled 2021-08-15: qty 5

## 2021-08-15 MED ORDER — FLEET ENEMA 7-19 GM/118ML RE ENEM: 1.0000 | ENEMA | Freq: Once | RECTAL | Status: DC | PRN

## 2021-08-15 MED ORDER — 0.9 % SODIUM CHLORIDE (POUR BTL) OPTIME
TOPICAL | Status: DC | PRN
  Administered 2021-08-15 (×2): 1000 mL

## 2021-08-15 MED ORDER — PROPOFOL 500 MG/50ML IV EMUL
INTRAVENOUS | Status: DC | PRN
  Administered 2021-08-15: 100 ug/kg/min via INTRAVENOUS

## 2021-08-15 MED ORDER — GABAPENTIN 300 MG PO CAPS
300.0000 mg | ORAL_CAPSULE | Freq: Four times a day (QID) | ORAL | Status: DC | PRN
  Administered 2021-08-16: 300 mg via ORAL
  Filled 2021-08-15: qty 1

## 2021-08-15 MED ORDER — GABAPENTIN 300 MG PO CAPS: 300.0000 mg | ORAL_CAPSULE | Freq: Two times a day (BID) | ORAL | 0 refills | Status: AC

## 2021-08-15 MED ORDER — INSULIN ASPART 100 UNIT/ML IJ SOLN
0.0000 [IU] | Freq: Three times a day (TID) | INTRAMUSCULAR | Status: DC
  Administered 2021-08-15: 8 [IU] via SUBCUTANEOUS
  Administered 2021-08-16: 5 [IU] via SUBCUTANEOUS
  Administered 2021-08-16: 2 [IU] via SUBCUTANEOUS

## 2021-08-15 MED ORDER — CHLORHEXIDINE GLUCONATE 0.12 % MT SOLN
15.0000 mL | Freq: Once | OROMUCOSAL | Status: AC
  Administered 2021-08-15: 15 mL via OROMUCOSAL
  Filled 2021-08-15: qty 15

## 2021-08-15 MED ORDER — OXYCODONE HCL 5 MG PO TABS
10.0000 mg | ORAL_TABLET | ORAL | Status: DC | PRN
  Administered 2021-08-15 – 2021-08-16 (×7): 10 mg via ORAL
  Filled 2021-08-15 (×7): qty 2

## 2021-08-15 MED ORDER — PROMETHAZINE HCL 25 MG/ML IJ SOLN: 6.2500 mg | INTRAMUSCULAR | Status: DC | PRN

## 2021-08-15 MED ORDER — ONDANSETRON HCL 4 MG PO TABS: 4.0000 mg | ORAL_TABLET | Freq: Three times a day (TID) | ORAL | 0 refills | Status: AC | PRN

## 2021-08-15 MED ORDER — METHOCARBAMOL 500 MG PO TABS: 500.0000 mg | ORAL_TABLET | Freq: Three times a day (TID) | ORAL | 0 refills | Status: AC | PRN

## 2021-08-15 MED ORDER — KETOROLAC TROMETHAMINE 30 MG/ML IJ SOLN
30.0000 mg | Freq: Once | INTRAMUSCULAR | Status: AC | PRN
  Administered 2021-08-15: 30 mg via INTRAVENOUS

## 2021-08-15 MED ORDER — PHENYLEPHRINE HCL-NACL 20-0.9 MG/250ML-% IV SOLN
INTRAVENOUS | Status: DC | PRN
  Administered 2021-08-15: 20 ug/min via INTRAVENOUS

## 2021-08-15 MED ORDER — LACTATED RINGERS IV SOLN: INTRAVENOUS | Status: DC

## 2021-08-15 MED ORDER — INSULIN LISPRO 100 UNIT/ML ~~LOC~~ SOLN: 2.0000 [IU] | Freq: Three times a day (TID) | SUBCUTANEOUS | Status: DC

## 2021-08-15 MED ORDER — DEXAMETHASONE SODIUM PHOSPHATE 4 MG/ML IJ SOLN
INTRAMUSCULAR | Status: DC | PRN
  Administered 2021-08-15: 10 mg via INTRAVENOUS

## 2021-08-15 MED ORDER — AMISULPRIDE (ANTIEMETIC) 5 MG/2ML IV SOLN: 10.0000 mg | Freq: Once | INTRAVENOUS | Status: DC | PRN

## 2021-08-15 MED ORDER — OXYCODONE HCL 5 MG PO TABS: 5.0000 mg | ORAL_TABLET | Freq: Once | ORAL | Status: DC | PRN

## 2021-08-15 MED ORDER — KETOROLAC TROMETHAMINE 30 MG/ML IJ SOLN
INTRAMUSCULAR | Status: AC
  Filled 2021-08-15: qty 1

## 2021-08-15 MED ORDER — FENTANYL CITRATE (PF) 250 MCG/5ML IJ SOLN
INTRAMUSCULAR | Status: AC
  Filled 2021-08-15: qty 5

## 2021-08-15 MED ORDER — OXYCODONE-ACETAMINOPHEN 10-325 MG PO TABS: 1.0000 | ORAL_TABLET | Freq: Four times a day (QID) | ORAL | 0 refills | Status: AC | PRN

## 2021-08-15 MED ORDER — DOCUSATE SODIUM 100 MG PO CAPS
100.0000 mg | ORAL_CAPSULE | Freq: Three times a day (TID) | ORAL | Status: DC | PRN
  Administered 2021-08-15: 100 mg via ORAL

## 2021-08-15 MED ORDER — OXYCODONE HCL 5 MG PO TABS: 5.0000 mg | ORAL_TABLET | ORAL | Status: DC | PRN

## 2021-08-15 MED ORDER — ONDANSETRON HCL 4 MG/2ML IJ SOLN
4.0000 mg | Freq: Four times a day (QID) | INTRAMUSCULAR | Status: DC | PRN
  Administered 2021-08-15: 4 mg via INTRAVENOUS
  Filled 2021-08-15: qty 2

## 2021-08-15 MED ORDER — AMITRIPTYLINE HCL 10 MG PO TABS
10.0000 mg | ORAL_TABLET | Freq: Every day | ORAL | Status: DC
  Administered 2021-08-15: 10 mg via ORAL
  Filled 2021-08-15 (×3): qty 1

## 2021-08-15 MED ORDER — BUPIVACAINE LIPOSOME 1.3 % IJ SUSP
INTRAMUSCULAR | Status: AC
  Filled 2021-08-15: qty 20

## 2021-08-15 MED ORDER — ONDANSETRON HCL 4 MG/2ML IJ SOLN
INTRAMUSCULAR | Status: DC | PRN
  Administered 2021-08-15: 4 mg via INTRAVENOUS

## 2021-08-15 MED ORDER — FENTANYL CITRATE (PF) 250 MCG/5ML IJ SOLN
INTRAMUSCULAR | Status: DC | PRN
  Administered 2021-08-15: 50 ug via INTRAVENOUS
  Administered 2021-08-15: 100 ug via INTRAVENOUS
  Administered 2021-08-15 (×4): 50 ug via INTRAVENOUS

## 2021-08-15 MED ORDER — ACETAMINOPHEN 500 MG PO TABS
1000.0000 mg | ORAL_TABLET | Freq: Once | ORAL | Status: AC
  Administered 2021-08-15: 1000 mg via ORAL
  Filled 2021-08-15: qty 2

## 2021-08-15 SURGICAL SUPPLY — 91 items
APPLIER CLIP 11 MED OPEN (CLIP)
BAG COUNTER SPONGE SURGICOUNT (BAG) ×2 IMPLANT
BLADE CLIPPER SURG (BLADE) IMPLANT
BLADE SURG 10 STRL SS (BLADE) ×2 IMPLANT
BONE MATRIX OSTEOCEL PRO LRG (Bone Implant) ×2 IMPLANT
CANNULA A INSULATED (CANNULA) ×2 IMPLANT
CANNULA B INSULATED (CANNULA) ×2 IMPLANT
CLIP APPLIE 11 MED OPEN (CLIP) IMPLANT
CLSR STERI-STRIP ANTIMIC 1/2X4 (GAUZE/BANDAGES/DRESSINGS) ×2 IMPLANT
CORD BIPOLAR FORCEPS 12FT (ELECTRODE) ×2 IMPLANT
COVER SURGICAL LIGHT HANDLE (MISCELLANEOUS) ×2 IMPLANT
DERMABOND ADVANCED (GAUZE/BANDAGES/DRESSINGS) ×1
DERMABOND ADVANCED .7 DNX12 (GAUZE/BANDAGES/DRESSINGS) ×1 IMPLANT
DISC SHIM ALUMINUM NS DISP (Neuro Prosthesis/Implant) ×2 IMPLANT
DRAIN CHANNEL 15F RND FF W/TCR (WOUND CARE) IMPLANT
DRAPE C-ARM 42X72 X-RAY (DRAPES) ×2 IMPLANT
DRAPE ORTHO SPLIT 77X108 STRL (DRAPES) ×1
DRAPE POUCH INSTRU U-SHP 10X18 (DRAPES) ×2 IMPLANT
DRAPE SURG ORHT 6 SPLT 77X108 (DRAPES) ×1 IMPLANT
DRAPE U-SHAPE 47X51 STRL (DRAPES) ×4 IMPLANT
DRSG AQUACEL AG ADV 3.5X 6 (GAUZE/BANDAGES/DRESSINGS) ×2 IMPLANT
DRSG OPSITE POSTOP 3X4 (GAUZE/BANDAGES/DRESSINGS) ×2 IMPLANT
DRSG OPSITE POSTOP 4X6 (GAUZE/BANDAGES/DRESSINGS) ×2 IMPLANT
DURAPREP 26ML APPLICATOR (WOUND CARE) ×2 IMPLANT
ELECT BLADE 4.0 EZ CLEAN MEGAD (MISCELLANEOUS) ×2
ELECT CAUTERY BLADE 6.4 (BLADE) ×2 IMPLANT
ELECT PENCIL ROCKER SW 15FT (MISCELLANEOUS) ×2 IMPLANT
ELECT REM PT RETURN 9FT ADLT (ELECTROSURGICAL) ×2
ELECTRODE BLDE 4.0 EZ CLN MEGD (MISCELLANEOUS) ×1 IMPLANT
ELECTRODE REM PT RTRN 9FT ADLT (ELECTROSURGICAL) ×1 IMPLANT
FEE INTRAOP CADWELL SUPPLY NCS (MISCELLANEOUS) ×1 IMPLANT
FEE INTRAOP MONITOR IMPULS NCS (MISCELLANEOUS) ×1 IMPLANT
GAUZE 4X4 16PLY ~~LOC~~+RFID DBL (SPONGE) ×2 IMPLANT
GLOVE SURG ENC MOIS LTX SZ6.5 (GLOVE) ×2 IMPLANT
GLOVE SURG MICRO LTX SZ8.5 (GLOVE) ×2 IMPLANT
GLOVE SURG UNDER POLY LF SZ6.5 (GLOVE) ×2 IMPLANT
GLOVE SURG UNDER POLY LF SZ8.5 (GLOVE) ×2 IMPLANT
GOWN STRL REUS W/ TWL LRG LVL3 (GOWN DISPOSABLE) ×1 IMPLANT
GOWN STRL REUS W/TWL 2XL LVL3 (GOWN DISPOSABLE) ×4 IMPLANT
GOWN STRL REUS W/TWL LRG LVL3 (GOWN DISPOSABLE) ×1
INTRAOP CADWELL SUPPLY FEE NCS (MISCELLANEOUS) ×1
INTRAOP DISP SUPPLY FEE NCS (MISCELLANEOUS) ×1
INTRAOP MONITOR FEE IMPULS NCS (MISCELLANEOUS) ×1
INTRAOP MONITOR FEE IMPULSE (MISCELLANEOUS) ×1
K-WIRE  1.6X 450L (WIRE) ×2
K-WIRE 1.6X 450L (WIRE) ×2
KIT BASIN OR (CUSTOM PROCEDURE TRAY) ×2 IMPLANT
KIT DISP MARS 3V (KITS) ×2 IMPLANT
KIT PEDICLE ACCESS (KITS) ×2 IMPLANT
KIT TURNOVER KIT B (KITS) ×2 IMPLANT
KNIFE BOYONETTED ANNULOTOMY (MISCELLANEOUS) ×2 IMPLANT
KWIRE 1.6X 450L (WIRE) ×2 IMPLANT
NEEDLE 22X1 1/2 (OR ONLY) (NEEDLE) ×2 IMPLANT
NEEDLE I-PASS III (NEEDLE) IMPLANT
NEEDLE SPNL 18GX3.5 QUINCKE PK (NEEDLE) ×2 IMPLANT
NS IRRIG 1000ML POUR BTL (IV SOLUTION) ×2 IMPLANT
PACK LAMINECTOMY ORTHO (CUSTOM PROCEDURE TRAY) ×2 IMPLANT
PACK UNIVERSAL I (CUSTOM PROCEDURE TRAY) ×2 IMPLANT
PAD ARMBOARD 7.5X6 YLW CONV (MISCELLANEOUS) ×4 IMPLANT
PIN TEMP CENTER HOLE 16 (PIN) ×2 IMPLANT
PLATE PLYMOUTH 4H 17 (Plate) ×2 IMPLANT
SCREW ANGLE VAR 5.5X48 (Screw) ×4 IMPLANT
SHIM DISC ALUMINUM (MISCELLANEOUS) ×2 IMPLANT
SHIM WIDENING (MISCELLANEOUS) ×4 IMPLANT
SHIM WIDENING MARS 3V (MISCELLANEOUS) ×4 IMPLANT
SPACER RISE-L 18X45 8-15MM6DEG (Spacer) ×2 IMPLANT
SPONGE INTESTINAL PEANUT (DISPOSABLE) ×4 IMPLANT
SPONGE SURGIFOAM ABS GEL 100 (HEMOSTASIS) IMPLANT
SPONGE T-LAP 4X18 ~~LOC~~+RFID (SPONGE) ×2 IMPLANT
STRIP CLOSURE SKIN 1/2X4 (GAUZE/BANDAGES/DRESSINGS) IMPLANT
SURGIFLO W/THROMBIN 8M KIT (HEMOSTASIS) IMPLANT
SUT BONE WAX W31G (SUTURE) ×2 IMPLANT
SUT MNCRL AB 3-0 PS2 27 (SUTURE) ×2 IMPLANT
SUT PROLENE 5 0 C 1 24 (SUTURE) IMPLANT
SUT SILK 2 0 TIES 10X30 (SUTURE) ×2 IMPLANT
SUT SILK 3 0 TIES 10X30 (SUTURE) ×2 IMPLANT
SUT VIC AB 0 CT1 18XCR BRD8 (SUTURE) ×1 IMPLANT
SUT VIC AB 0 CT1 8-18 (SUTURE) ×1
SUT VIC AB 1 CT1 27 (SUTURE) ×2
SUT VIC AB 1 CT1 27XBRD ANBCTR (SUTURE) ×2 IMPLANT
SUT VIC AB 1 CTX 36 (SUTURE) ×2
SUT VIC AB 1 CTX36XBRD ANBCTR (SUTURE) ×2 IMPLANT
SUT VIC AB 2-0 CT1 18 (SUTURE) ×2 IMPLANT
SYR BULB IRRIG 60ML STRL (SYRINGE) ×2 IMPLANT
SYR CONTROL 10ML LL (SYRINGE) ×2 IMPLANT
TAPE CLOTH 4X10 WHT NS (GAUZE/BANDAGES/DRESSINGS) ×4 IMPLANT
TOWEL GREEN STERILE (TOWEL DISPOSABLE) ×2 IMPLANT
TOWEL GREEN STERILE FF (TOWEL DISPOSABLE) ×2 IMPLANT
TRAY FOLEY W/BAG SLVR 16FR (SET/KITS/TRAYS/PACK) ×1
TRAY FOLEY W/BAG SLVR 16FR ST (SET/KITS/TRAYS/PACK) ×1 IMPLANT
WATER STERILE IRR 1000ML POUR (IV SOLUTION) ×2 IMPLANT

## 2021-08-15 NOTE — Anesthesia Procedure Notes (Signed)
Procedure Name: Intubation Date/Time: 08/15/2021 11:10 AM Performed by: Minerva Ends, CRNA Pre-anesthesia Checklist: Patient identified, Emergency Drugs available, Suction available and Patient being monitored Patient Re-evaluated:Patient Re-evaluated prior to induction Oxygen Delivery Method: Circle system utilized Preoxygenation: Pre-oxygenation with 100% oxygen Induction Type: IV induction Ventilation: Mask ventilation without difficulty Laryngoscope Size: Mac and 3 Grade View: Grade II Tube type: Oral Tube size: 7.0 mm Number of attempts: 1 Airway Equipment and Method: Stylet and Oral airway Placement Confirmation: ETT inserted through vocal cords under direct vision, positive ETCO2 and breath sounds checked- equal and bilateral Secured at: 22 cm Tube secured with: Tape Dental Injury: Teeth and Oropharynx as per pre-operative assessment

## 2021-08-15 NOTE — Discharge Instructions (Signed)

## 2021-08-15 NOTE — H&P (Signed)
Addendum H&P: There is been no change in the patient's clinical exam since her last office visit of 08/06/2022.  She continues to have significant back buttock and neuropathic leg pain.  We have gone over the surgical plan which is a lateral interbody fusion L3-4 with lateral plate application.  If I am unable to place the plate then I will have to move forward with posterior pedicle screw fixation.  We have gone over the risks, benefits, and alternatives to surgery and all of her questions were encouraged and addressed.  She is expressed a willingness to move forward with surgery.

## 2021-08-15 NOTE — Transfer of Care (Signed)
Immediate Anesthesia Transfer of Care Note  Patient: Abigail Lawrence  Procedure(s) Performed: ANTERIOR LATERAL LUMBAR FUSION WITH PERCUTANEOUS SCREW 1 LEVEL (XLIF WITH LATERAL PLATE M4-2)  Patient Location: PACU  Anesthesia Type:General  Level of Consciousness: awake, alert  and oriented  Airway & Oxygen Therapy: Patient Spontanous Breathing  Post-op Assessment: Report given to RN and Post -op Vital signs reviewed and stable  Post vital signs: Reviewed and stable  Last Vitals:  Vitals Value Taken Time  BP 153/69 08/15/21 1406  Temp    Pulse 84 08/15/21 1408  Resp 17 08/15/21 1408  SpO2 99 % 08/15/21 1408  Vitals shown include unvalidated device data.  Last Pain:  Vitals:   08/15/21 0933  TempSrc:   PainSc: 5       Patients Stated Pain Goal: 0 (08/15/21 0933)  Complications: No notable events documented.

## 2021-08-15 NOTE — Brief Op Note (Signed)
08/15/2021  1:55 PM  PATIENT:  Abigail Lawrence  65 y.o. 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  POST-OPERATIVE DIAGNOSIS:  * No post-op diagnosis entered *  PROCEDURE:  Procedure(s): ANTERIOR LATERAL LUMBAR FUSION WITH PERCUTANEOUS SCREW 1 LEVEL (XLIF WITH LATERAL PLATE B8-6) (N/A)  SURGEON:  Surgeon(s) and Role:    Venita Lick, MD - Primary  PHYSICIAN ASSISTANT:   ASSISTANTS: Voncille Lo, PA  ANESTHESIA:   general  EBL:  50 mL   BLOOD ADMINISTERED:none  DRAINS: none   LOCAL MEDICATIONS USED:  MARCAINE     SPECIMEN:  No Specimen  DISPOSITION OF SPECIMEN:  N/A  COUNTS:  YES  TOURNIQUET:  * No tourniquets in log *  DICTATION: .Dragon Dictation  PLAN OF CARE: Admit for overnight observation  PATIENT DISPOSITION:  PACU - hemodynamically stable.

## 2021-08-15 NOTE — Op Note (Signed)
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  Previous TLIF L4-5  POST-OPERATIVE DIAGNOSIS: Same  PROCEDURE:   Lateral L3-4 fusion with application of lateral plate.  SURGEON:  Venita Lick, MD  PHYSICIAN ASSISTANT: Voncille Lo, PA  ANESTHESIA:   General  EBL: 50 ml   Complications: None  Implants: 1.  Globus rise L cage.  18 mm wide, 45 mm length.  8 to 15 mm expansion.  Cage was expanded to a total of 12.5 mm.      2.  This limits lateral 4-hole plate -17 mm length.  48 mm locking screws x3.  2 screws placed into L3; single screw into L4.  Graft: Osteocell  Neuro monitoring: No adverse free running EMG or SSEP activity.  No evidence of compression or trauma to the lumbar plexus.  BRIEF HISTORY: Abigail Lawrence is a 65 y.o. female who had a previous L4-5 TLIF several years ago.  Overall she did well until recently when she started having recurrent back buttock and episodic neuropathic right leg pain.  Imaging studies demonstrated adjacent segment degenerative disease at L3-4.  There was slight anterior listhesis as well as collapse of the disc space leading to foraminal stenosis as well as significant facet arthrosis.  Small synovial cyst was also noted.  Unfortunately, conservative management did not provide any significant improvement in quality of life.  As a result we elected to move forward with surgery.  All appropriate risks, benefits, and alternatives were discussed with patient and consent was obtained.  PROCEDURE DETAILS: Patient was brought into the operating room and was properly positioned on the operating room table.  After induction with general anesthesia the patient was endotracheally intubated.  A timeout was taken to confirm all important data: including patient, procedure, and the  level. Teds, SCD's were applied, Foley was inserted.  The neuro monitoring representative then placed all appropriate needles for monitoring SSEP and free running EMG activity.  Patient was then turned into the lateral decubitus position left side up.  Axillary roll was placed and pads were placed below all bony prominences.  The legs were gently flexed at the knee and using fluoroscopy I confirmed satisfactory positioning of the patient.  The L3-4 was properly positioned.  The patient was secured directly to the table with tape to prevent motion.  At this point the lateral flank was prepped and draped in a standard fashion.  Lateral fluoroscopy was used to mark out the incision site.  I then infiltrated the incision with quarter percent Marcaine with epinephrine.  A lateral incision was made and sharp dissection was carried out down to the fascia of the external oblique.  I then made a second smaller incision 1 fingerbreadth posteriorly and then bluntly dissected down to the posterior aspect of the retroperitoneum.  I advanced into the retroperitoneum and began mobilizing the tissue.  I then began bluntly dissecting from the undersurface of the oblique muscle until I could see my finger in the lateral incision. I then passed the initial dilating tube down to the surface of the psoas muscle.  I stimulated the psoas to confirm I was not traumatizing the plexus.  Using lateral fluoroscopy I confirmed satisfactory position in the posterior third of the disc space.  I then dissected through the psoas down to the lateral aspect of the annulus.  A guidepin was then  placed to secure this in position.  I then stimulated circumferentially with the initial dilating portal confirming I was not traumatizing the plexus.  The second dilator was placed over this and again circumferentially I stimulated to confirm no abnormal free running EMG activity.  The working retractor was then placed and secured to the bed with the  retracting arm.  I then remove the 2 dilators and I could see the lateral aspect of the disc space.  I then stimulated with the probe circumferentially behind each blade and identified where the plexus was.  I had adequate cuff of muscle protecting it.  In addition there was no abnormal free running EMG activity.  Once I properly position the posterior blade I then secured it directly into the disc space with a shim.  I then advanced the lateral blades forward so I had complete visualization of the entire lateral aspect of the disc space.  I confirmed satisfactory position of the retractor and visualization of the disc space with AP and lateral fluoroscopy.  An annulotomy was then performed and I used a Cobb elevator to advance along the endplate of L3 and L4 until I was at the contralateral annulus.  I then released the contralateral annulus and then used my box osteotome to remove the bulk of the disc material.  Then used the curettes and rasps to remove the remaining portion of the cartilaginous endplate side bleeding subchondral bone.  I then removed some of the disc marrow posteriorly so that I can get an adequate posterior decompression as well as restoration of the normal sagittal alignment.  I then placed the trial expandable cage into the disc space and expanded from a 7 mm to a 12 mm height.  This provided satisfactory reduction of the spondylolisthesis as well as restoration of the foraminal space.  The 45 mm length expandable cage was then packed with the bone graft and I remove the trial implant.  I irrigated the wound copiously normal saline, and then inserted the implant.  The cage was then expanded from 8 mm cage to a final height of 12.5 mm.  I then backfilled the cage with additional graft.  Images at this point time demonstrated excellent restoration of intervertebral height, correction of the spondylolisthesis, as well as restoration of normal foraminal volume.  Additional bone graft was then  placed over the cage to serve as a sentinel fusion.  At this point I then obtained a 4-hole lateral plate and inserted it into the wound.  I secured the temporary locking screw, and then placed the awl.  The 48 mm screw was then placed through the posterior hole and into the L4 vertebral body.  I was able to pass this below the pedicle screw.  I then placed a posterior screw into L3 and more anterior screw into L3.  All 3 screws had excellent bicortical purchase.  I tried to place the fourth screw but it was directly over the tip of the pedicle screw and I could not advance it.  At this point I elected to leave with the fourth screw vacant as I had excellent purchase and the overall construct was stable.  The wound was copiously irrigated with normal saline and I confirmed hemostasis.  I then removed the retractors and took my final AP and lateral x-ray confirming that there was no retained surgical instruments, as well as improvement in the sagittal and coronal alignment.  I then closed the fascia of the external oblique with interrupted #  1 Vicryl sutures, then a running 0 Vicryl suture then interrupted 2-0 Vicryl sutures for the deep dermis, and a 3-0 Monocryl for the skin.  The posterior incision was also irrigated and closed in a layered fashion with interrupted #1 Vicryl suture, 2-0 Vicryl suture, and 3-0 Monocryl for the skin.  Steri-Strips and dry dressings were applied and the patient was ultimately extubated and transferred to the PACU without incident.  The end of the case all needle and sponge counts were correct.  There were no adverse intraoperative events.  First Assistant: Voncille Lo, PA: She was instrumental in assisting with positioning of the patient, retraction to allow for visualization, assistance with implantation of the device as well as wound closure.  Venita Lick, MD 08/15/2021 1:33 PM

## 2021-08-15 NOTE — Anesthesia Postprocedure Evaluation (Signed)
Anesthesia Post Note  Patient: Abigail Lawrence  Procedure(s) Performed: ANTERIOR LATERAL LUMBAR FUSION WITH PERCUTANEOUS SCREW 1 LEVEL (XLIF WITH LATERAL PLATE V7-8)     Patient location during evaluation: PACU Anesthesia Type: General Level of consciousness: awake and alert, oriented and patient cooperative Pain management: pain level controlled Vital Signs Assessment: post-procedure vital signs reviewed and stable Respiratory status: spontaneous breathing, nonlabored ventilation and respiratory function stable Cardiovascular status: blood pressure returned to baseline and stable Postop Assessment: no apparent nausea or vomiting Anesthetic complications: no   No notable events documented.  Last Vitals:  Vitals:   08/15/21 1451 08/15/21 1541  BP: (!) 157/71 (!) 170/77  Pulse: 88 94  Resp: 11 16  Temp: 36.6 C   SpO2: 100% 98%                   Lannie Fields

## 2021-08-16 LAB — GLUCOSE, CAPILLARY
Glucose-Capillary: 222 mg/dL — ABNORMAL HIGH (ref 70–99)
Glucose-Capillary: 141 mg/dL — ABNORMAL HIGH (ref 70–99)

## 2021-08-16 NOTE — Discharge Summary (Signed)
Patient ID: Abigail Lawrence MRN: [id number] DOB/AGE: [birth year] 65 y.o.  Admit date: 08/15/2021 Discharge date: 08/16/2021  Admission Diagnoses:  Principal Problem:   S/P lumbar fusion   Discharge Diagnoses:  Principal Problem:   S/P lumbar fusion  status post Procedure(s): ANTERIOR LATERAL LUMBAR FUSION WITH PERCUTANEOUS SCREW 1 LEVEL (XLIF WITH LATERAL PLATE O3-7)  Past Medical History:  Diagnosis Date   Arthritis    Depression    Diabetes mellitus without complication (HCC)    Fibromyalgia     Surgeries: Procedure(s): ANTERIOR LATERAL LUMBAR FUSION WITH PERCUTANEOUS SCREW 1 LEVEL (XLIF WITH LATERAL PLATE C5-8) on 85/10/7739   Consultants:   Discharged Condition: Improved  Hospital Course: Abigail Lawrence is an 65 y.o. female who was admitted 08/15/2021 for operative treatment of S/P lumbar fusion. Patient failed conservative treatments (please see the history and physical for the specifics) and had severe unremitting pain that affects sleep, daily activities and work/hobbies. After pre-op clearance, the patient was taken to the operating room on 08/15/2021 and underwent  Procedure(s): ANTERIOR LATERAL LUMBAR FUSION WITH PERCUTANEOUS SCREW 1 LEVEL (XLIF WITH LATERAL PLATE O8-7).    Patient was given perioperative antibiotics:  Anti-infectives (From admission, onward)    Start     Dose/Rate Route Frequency Ordered Stop   08/15/21 1930  ceFAZolin (ANCEF) IVPB 1 g/50 mL premix        1 g 100 mL/hr over 30 Minutes Intravenous Every 8 hours 08/15/21 1527 08/16/21 0341   08/15/21 0907  ceFAZolin (ANCEF) IVPB 2g/100 mL premix        2 g 200 mL/hr over 30 Minutes Intravenous 30 min pre-op 08/15/21 8676 08/15/21 1135        Patient was given sequential compression devices and early ambulation to prevent DVT.   Patient benefited maximally from hospital stay and there were no complications. At the time of discharge, the patient was urinating/moving their bowels without  difficulty, tolerating a regular diet, pain is controlled with oral pain medications and they have been cleared by PT/OT.   Recent vital signs: Patient Vitals for the past 24 hrs:  BP Temp Temp src Pulse Resp SpO2  08/16/21 0942 (!) 114/51 98.8 F (37.1 C) Oral 73 16 100 %  08/16/21 0401 135/64 98.6 F (37 C) Oral 85 18 98 %  08/15/21 2300 129/64 97.7 F (36.5 C) Oral 90 18 95 %  08/15/21 1934 (!) 141/63 98.5 F (36.9 C) Oral 96 18 94 %  08/15/21 1541 (!) 170/77 -- -- 94 16 98 %     Recent laboratory studies:  Recent Labs    08/13/21 1530  WBC 3.3*  HGB 11.3*  HCT 34.7*  PLT 222  NA 136  K 3.9  CL 98  CO2 30  BUN 15  CREATININE 0.65  GLUCOSE 121*  INR 1.0  CALCIUM 9.1     Discharge Medications:   Allergies as of 08/16/2021       Reactions   Codeine Other (See Comments)   headache   Sulfa Antibiotics Swelling, Rash        Medication List     STOP taking these medications    BENGAY EX   HYDROcodone-acetaminophen 5-325 MG tablet Commonly known as: NORCO/VICODIN   meloxicam 15 MG tablet Commonly known as: MOBIC       TAKE these medications    acetaminophen 500 MG tablet Commonly known as: TYLENOL Take 1,000 mg by mouth every 6 (six) hours as needed for moderate pain.  amitriptyline 10 MG tablet Commonly known as: ELAVIL Take 10 mg by mouth at bedtime.   docusate sodium 100 MG capsule Commonly known as: Colace Take 1 capsule (100 mg total) by mouth 3 (three) times daily as needed for mild constipation.   escitalopram 10 MG tablet Commonly known as: LEXAPRO Take 10 mg by mouth daily.   gabapentin 300 MG capsule Commonly known as: NEURONTIN Take 1 capsule (300 mg total) by mouth 2 (two) times daily. What changed:  when to take this reasons to take this   HumaLOG 100 UNIT/ML injection Generic drug: insulin lispro Inject 2-10 Units into the skin 4 (four) times daily -  with meals and at bedtime. Per sliding scale   losartan 25 MG  tablet Commonly known as: COZAAR Take 25 mg by mouth daily.   methocarbamol 500 MG tablet Commonly known as: Robaxin Take 1 tablet (500 mg total) by mouth every 8 (eight) hours as needed for up to 5 days for muscle spasms. What changed: when to take this   ondansetron 4 MG tablet Commonly known as: Zofran Take 1 tablet (4 mg total) by mouth every 8 (eight) hours as needed for nausea or vomiting.   oxyCODONE-acetaminophen 10-325 MG tablet Commonly known as: Percocet Take 1 tablet by mouth every 6 (six) hours as needed for up to 5 days for pain.   rOPINIRole 0.25 MG tablet Commonly known as: REQUIP Take 0.125-0.25 mg by mouth at bedtime as needed (restless legs).   SYSTANE OP Place 1 drop into both eyes daily as needed (dry eyes).   Toujeo SoloStar 300 UNIT/ML Solostar Pen Generic drug: insulin glargine (1 Unit Dial) Inject 18 Units into the skin at bedtime.        Diagnostic Studies: DG Lumbar Spine 2-3 Views  Result Date: 08/15/2021 CLINICAL DATA:  L3-4 fusion. EXAM: LUMBAR SPINE - 2-3 VIEW COMPARISON:  Lumbar spine x-ray 10/27/2014. FINDINGS: Intraoperative lumbar spine. 2 low resolution intraoperative spot views of the lumbar spine were obtained. L4-L5 posterior fusion hardware is again noted. There is stable anterolisthesis at this level. Additional hardware seen at the L3-L4 level on the left. Total fluoroscopy time: 2 minutes 9 seconds Total radiation dose: 52.15 micro Gy IMPRESSION: Intraoperative L3-L4 fusion. Electronically Signed   By: Darliss Cheney M.D.   On: 08/15/2021 15:14   DG C-Arm 1-60 Min-No Report  Result Date: 08/15/2021 Fluoroscopy was utilized by the requesting physician.  No radiographic interpretation.   DG C-Arm 1-60 Min-No Report  Result Date: 08/15/2021 Fluoroscopy was utilized by the requesting physician.  No radiographic interpretation.    Discharge Instructions     Incentive spirometry RT   Complete by: As directed         Follow-up  Information     Venita Lick, MD. Schedule an appointment as soon as possible for a visit in 2 week(s).   Specialty: Orthopedic Surgery Why: As needed, If symptoms worsen, For suture removal, For wound re-check Contact information: [address] [place] [address] [phone]                 Discharge Plan:  discharge to home  Disposition: stable    Signed: Rhodia Albright for [facility] PA-C [facility] [phone] 08/16/2021, 2:51 PM

## 2021-08-16 NOTE — Plan of Care (Signed)
Patient alert and oriented, voiding adequately, skin clean, dry and intact without evidence of skin break down, or symptoms of complications - no redness or edema noted, only slight tenderness at site.  Patient states pain is manageable at time of discharge. Patient has an appointment with MD in 2 weeks 

## 2021-08-16 NOTE — Evaluation (Signed)
Physical Therapy Evaluation Patient Details Name: Abigail Lawrence MRN: [id number] DOB: 07/02/1956 Today's Date: 08/16/2021  History of Present Illness  Pt is a 65 y.o. female s/p lateral L3-4 fusion with application of lateral plate. PMH includes arthritis, depression, DM, and fibromyalgia   Clinical Impression  Pt admitted with above diagnosis. At the time of PT eval, pt was able to demonstrate transfers with modified independence and ambulation with gross supervision for safety and no AD. Pt was educated on precautions, brace application/wearing schedule, appropriate activity progression, and car transfer. Pt currently with functional limitations due to the deficits listed below (see PT Problem List). Pt will benefit from skilled PT to increase their independence and safety with mobility to allow discharge to the venue listed below.         Recommendations for follow up therapy are one component of a multi-disciplinary discharge planning process, led by the attending physician.  Recommendations may be updated based on patient status, additional functional criteria and insurance authorization.  Follow Up Recommendations No PT follow up    Assistance Recommended at Discharge PRN  Functional Status Assessment Patient has had a recent decline in their functional status and demonstrates the ability to make significant improvements in function in a reasonable and predictable amount of time.  Equipment Recommendations  None recommended by PT    Recommendations for Other Services       Precautions / Restrictions Precautions Precautions: Back Precaution Booklet Issued: Yes (comment) Precaution Comments: reviewed back precautions with pt, pt able to verbalize 3/3 precautions Required Braces or Orthoses: Spinal Brace Spinal Brace: Lumbar corset;Applied in sitting position Restrictions Weight Bearing Restrictions: No      Mobility  Bed Mobility Overal bed mobility: Modified Independent              General bed mobility comments: HOB flat and rails lowered to simulate home environment.    Transfers Overall transfer level: Modified independent Equipment used: None Transfers: Sit to/from Stand Sit to Stand: Supervision           General transfer comment: No assist required. Pt demonstrated proper hand placement on seated surface for safety.    Ambulation/Gait Ambulation/Gait assistance: Supervision Gait Distance (Feet): 400 Feet Assistive device: None Gait Pattern/deviations: Step-through pattern;Decreased stride length;Trunk flexed Gait velocity: Decreased Gait velocity interpretation: <1.8 ft/sec, indicate of risk for recurrent falls   General Gait Details: VC's for improved posture. Slow pace with occasional reaching out for the railing in the hallway. No overt LOB noted.  Stairs            Wheelchair Mobility    Modified Rankin (Stroke Patients Only)       Balance Overall balance assessment: Mild deficits observed, not formally tested                                           Pertinent Vitals/Pain Pain Assessment: Faces Pain Score: 2  Faces Pain Scale: Hurts a little bit Pain Location: incision site Pain Descriptors / Indicators: Discomfort;Constant Pain Intervention(s): Limited activity within patient's tolerance;Monitored during session;Repositioned    Home Living Family/patient expects to be discharged to:: Private residence Living Arrangements: Spouse/significant other Available Help at Discharge: Family;Available 24 hours/day Type of Home: House Home Access: Stairs to enter Entrance Stairs-Rails: None Entrance Stairs-Number of Steps: 1 up onto porch and then 1 into the house   Home Layout: Able to  live on main level with bedroom/bathroom Home Equipment: Shower seat;Grab bars - toilet      Prior Function Prior Level of Function : Independent/Modified Independent                     Hand Dominance    Dominant Hand: Right    Extremity/Trunk Assessment   Upper Extremity Assessment Upper Extremity Assessment: Defer to OT evaluation    Lower Extremity Assessment Lower Extremity Assessment: Generalized weakness (Consistent with pre-op diagnosis)    Cervical / Trunk Assessment Cervical / Trunk Assessment: Back Surgery  Communication   Communication: No difficulties  Cognition Arousal/Alertness: Awake/alert Behavior During Therapy: WFL for tasks assessed/performed Overall Cognitive Status: Within Functional Limits for tasks assessed                                          General Comments      Exercises     Assessment/Plan    PT Assessment Patient needs continued PT services  PT Problem List Decreased strength;Decreased balance;Decreased activity tolerance;Decreased mobility;Decreased knowledge of use of DME;Decreased safety awareness;Decreased knowledge of precautions;Pain       PT Treatment Interventions DME instruction;Gait training;Functional mobility training;Therapeutic activities;Therapeutic exercise;Neuromuscular re-education;Patient/family education    PT Goals (Current goals can be found in the Care Plan section)  Acute Rehab PT Goals Patient Stated Goal: Home today PT Goal Formulation: With patient Time For Goal Achievement: 08/23/21 Potential to Achieve Goals: Good    Frequency Min 5X/week   Barriers to discharge        Co-evaluation               AM-PAC PT "6 Clicks" Mobility  Outcome Measure Help needed turning from your back to your side while in a flat bed without using bedrails?: None Help needed moving from lying on your back to sitting on the side of a flat bed without using bedrails?: None Help needed moving to and from a bed to a chair (including a wheelchair)?: None Help needed standing up from a chair using your arms (e.g., wheelchair or bedside chair)?: None Help needed to walk in hospital room?: A Little Help  needed climbing 3-5 steps with a railing? : A Little 6 Click Score: 22    End of Session Equipment Utilized During Treatment: Back brace Activity Tolerance: Patient tolerated treatment well Patient left: in bed;with call bell/phone within reach Nurse Communication: Mobility status PT Visit Diagnosis: Unsteadiness on feet (R26.81)    Time: 0347-4259 PT Time Calculation (min) (ACUTE ONLY): 25 min   Charges:   PT Evaluation $PT Eval Low Complexity: 1 Low PT Treatments $Gait Training: 8-22 mins        Conni Slipper, PT, DPT Acute Rehabilitation Services Pager: [phone] Office: [phone]   Marylynn Pearson 08/16/2021, 10:44 AM

## 2021-08-16 NOTE — Evaluation (Signed)
Occupational Therapy Evaluation Patient Details Name: Abigail Lawrence MRN: [id number] DOB: Apr 21, 1956 Today's Date: 08/16/2021   History of Present Illness Pt is a 65 y.o. female s/p lateral L3-4 fusion with application of lateral plate. PMH includes arthritis, depression, DM, and fibromyalgia   Clinical Impression   Pt independent with ADLs and functional mobility at baseline, has family assistance at d/c. Pt supervision - min A for ADLs during session, requiring increased assistance for LB ADLs and verbal cuing to adhere to precautions. Pt mod I for bed mobility and supervision with transfers for safety. Educated pt on compensatory LB dressing/bathing strategies, back precautions, and log rolling technique, pt verbalized and demonstrated understanding, able to adhere to back precautions throughout session. Pt limited by decreased activity tolerance, ROM, and pain at this time, however has no acute OT needs. Will s/o, recommend d/c home with assistance.     Recommendations for follow up therapy are one component of a multi-disciplinary discharge planning process, led by the attending physician.  Recommendations may be updated based on patient status, additional functional criteria and insurance authorization.   Follow Up Recommendations  No OT follow up    Assistance Recommended at Discharge PRN  Functional Status Assessment  Patient has had a recent decline in their functional status and demonstrates the ability to make significant improvements in function in a reasonable and predictable amount of time.  Equipment Recommendations  None recommended by OT;Other (comment) (pt has all needed DME at home)    Recommendations for Other Services PT consult     Precautions / Restrictions Precautions Precautions: Back Precaution Booklet Issued: Yes (comment) Precaution Comments: reviewed back precautions with pt, pt able to verbalize 3/3 precautions Required Braces or Orthoses: Spinal  Brace Spinal Brace: Lumbar corset;Applied in sitting position Restrictions Weight Bearing Restrictions: No      Mobility Bed Mobility Overal bed mobility: Modified Independent             General bed mobility comments: mod I using log rolling technique    Transfers Overall transfer level: Needs assistance Equipment used: None Transfers: Sit to/from Stand Sit to Stand: Supervision           General transfer comment: supervision for safety, pt states she feels "loopy" from pain meds, however reports no dizziness/nausea/increased pain      Balance Overall balance assessment: No apparent balance deficits (not formally assessed)                                         ADL either performed or assessed with clinical judgement   ADL Overall ADL's : Needs assistance/impaired Eating/Feeding: Set up;Sitting   Grooming: Standing;Wash/dry hands;Wash/dry face;Oral care;Supervision/safety   Upper Body Bathing: Minimal assistance;Sitting   Lower Body Bathing: Supervison/ safety;With adaptive equipment;Sitting/lateral leans Lower Body Bathing Details (indicate cue type and reason): Educated pt on using long handle sponge in shower for LB bathing Upper Body Dressing : Supervision/safety;Sitting Upper Body Dressing Details (indicate cue type and reason): able to don brace, bra, and shirt sitting EOB, v/cing for safety Lower Body Dressing: Minimal assistance;Sitting/lateral leans Lower Body Dressing Details (indicate cue type and reason): Min A using figure 4 technique to assist with donning pant leg. Pt independently able to don socks and shoes using figure 4 technique Toilet Transfer: Supervision/safety;Ambulation;Regular Toilet   Toileting- Clothing Manipulation and Hygiene: Supervision/safety;Cueing for back precautions;Sit to/from stand Toileting - Architect Details (  indicate cue type and reason): Educated pt on bending knees for wiping, also using  flushable wipes if unable to effectively complete pericare Tub/ Shower Transfer: Supervision/safety;Shower seat;Ambulation Tub/Shower Transfer Details (indicate cue type and reason): Edcuated pt on shower transfer safety, using shower seat and AE for bathing, showering with supervision for safety once cleared to shower Functional mobility during ADLs: Supervision/safety       Vision Baseline Vision/History: 1 Wears glasses Vision Assessment?: No apparent visual deficits     Perception     Praxis      Pertinent Vitals/Pain Pain Assessment: Faces Pain Score: 2  Faces Pain Scale: Hurts a little bit Pain Location: incision site Pain Descriptors / Indicators: Discomfort;Constant Pain Intervention(s): Limited activity within patient's tolerance;Monitored during session;Repositioned     Hand Dominance Right   Extremity/Trunk Assessment Upper Extremity Assessment Upper Extremity Assessment: Overall WFL for tasks assessed   Lower Extremity Assessment Lower Extremity Assessment: Defer to PT evaluation   Cervical / Trunk Assessment Cervical / Trunk Assessment: Back Surgery   Communication Communication Communication: No difficulties   Cognition Arousal/Alertness: Awake/alert Behavior During Therapy: WFL for tasks assessed/performed Overall Cognitive Status: Within Functional Limits for tasks assessed                                       General Comments       Exercises     Shoulder Instructions      Home Living Family/patient expects to be discharged to:: Private residence Living Arrangements: Spouse/significant other Available Help at Discharge: Family;Other (Comment) (has family coming in to assist at d/c) Type of Home: House Home Access: Stairs to enter [employer] of Steps: 2 Entrance Stairs-Rails: None Home Layout: Able to live on main level with bedroom/bathroom     Bathroom Shower/Tub: Producer, television/film/video:  Standard Bathroom Accessibility: No   Home Equipment: Shower seat;Grab bars - toilet          Prior Functioning/Environment Prior Level of Function : Independent/Modified Independent                        OT Problem List: Decreased strength;Decreased range of motion;Decreased activity tolerance;Impaired balance (sitting and/or standing);Pain;Decreased knowledge of precautions      OT Treatment/Interventions:      OT Goals(Current goals can be found in the care plan section) Acute Rehab OT Goals Patient Stated Goal: return PLOF OT Goal Formulation: With patient Time For Goal Achievement: 08/30/21 Potential to Achieve Goals: Good  OT Frequency:     Barriers to D/C:            Co-evaluation              AM-PAC OT "6 Clicks" Daily Activity     Outcome Measure Help from another person eating meals?: None Help from another person taking care of personal grooming?: None Help from another person toileting, which includes using toliet, bedpan, or urinal?: A Little Help from another person bathing (including washing, rinsing, drying)?: A Little Help from another person to put on and taking off regular upper body clothing?: None Help from another person to put on and taking off regular lower body clothing?: A Little 6 Click Score: 21   End of Session Equipment Utilized During Treatment: Gait belt Nurse Communication: Mobility status  Activity Tolerance: Patient tolerated treatment well Patient left: in chair;with call bell/phone  within reach  OT Visit Diagnosis: Unsteadiness on feet (R26.81);Muscle weakness (generalized) (M62.81);Pain                Time: 5956-3875 OT Time Calculation (min): 35 min Charges:  OT General Charges $OT Visit: 1 Visit OT Evaluation $OT Eval Low Complexity: 1 Low OT Treatments $Self Care/Home Management : 8-22 mins  Abigail Lawrence, OTD, OTR/L Acute Rehab [phone]   Abigail Lawrence 08/16/2021, 8:31 AM

## 2021-08-16 NOTE — Progress Notes (Signed)
    Subjective: Procedure(s) (LRB): ANTERIOR LATERAL LUMBAR FUSION WITH PERCUTANEOUS SCREW 1 LEVEL (XLIF WITH LATERAL PLATE V6-1) (N/A) 1 Day Post-Op  Patient reports pain as 1 on 0-10 scale.  Reports decreased leg pain reports incisional back pain   Positive void Positive bowel movement Positive flatus Negative chest pain or shortness of breath  Objective: Vital signs in last 24 hours: Temp:  [97.7 F (36.5 C)-98.8 F (37.1 C)] 98.8 F (37.1 C) (12/01 0942) Pulse Rate:  [73-96] 73 (12/01 0942) Resp:  [11-18] 16 (12/01 0942) BP: (114-170)/(51-80) 114/51 (12/01 0942) SpO2:  [94 %-100 %] 100 % (12/01 0942)  Intake/Output from previous day: 11/30 0701 - 12/01 0700 In: 1100 [I.V.:1100] Out: 950 [Urine:900; Blood:50]  Labs: Recent Labs    08/13/21 1530  WBC 3.3*  RBC 3.69*  HCT 34.7*  PLT 222   Recent Labs    08/13/21 1530  NA 136  K 3.9  CL 98  CO2 30  BUN 15  CREATININE 0.65  GLUCOSE 121*  CALCIUM 9.1   Recent Labs    08/13/21 1530  INR 1.0    Physical Exam: Neurologically intact ABD soft Intact pulses distally Dorsiflexion/Plantar flexion intact Incision: dressing C/D/I and no drainage Compartment soft Body mass index is 25.9 kg/m.   Assessment/Plan: Patient stable  Continue mobilization with physical therapy Continue care  Patient is done very well.  She is ambulating, tolerating a regular diet, and has minimal complaints of pain.  Plan on discharge to home today.  She will follow-up with me in 3 weeks.  I provided her with Percocet for pain and Robaxin as a muscle relaxer.  I did tell her if she is having mild pain that she could use Tylenol instead.  If there is any issues or problems she knows to contact me.  Otherwise I will see her in 2 weeks for postoperative check  Venita Lick, MD [facility] [phone]

## 2021-08-21 ENCOUNTER — Encounter (HOSPITAL_COMMUNITY): Payer: Self-pay | Admitting: Orthopedic Surgery

## 2022-09-13 IMAGING — RF DG LUMBAR SPINE 2-3V
1 series · 2 of 2 positions shown · non-contrast
Comparison: Lumbar spine x-ray 10/27/2014.

CLINICAL DATA: L3-4 fusion.

EXAM:
LUMBAR SPINE - 2-3 VIEW

[Series 1: run · 2 of 2 slices shown]
[im 1/2]
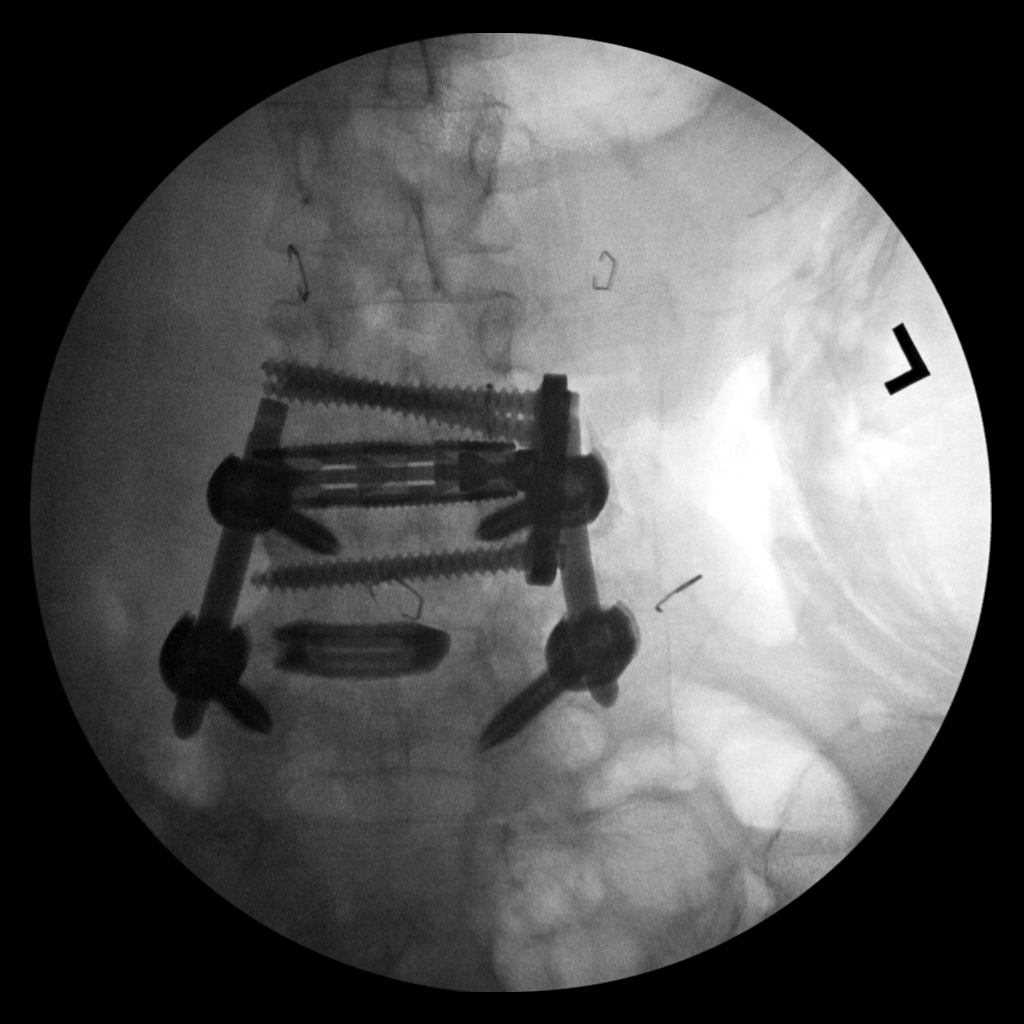
[im 2/2]
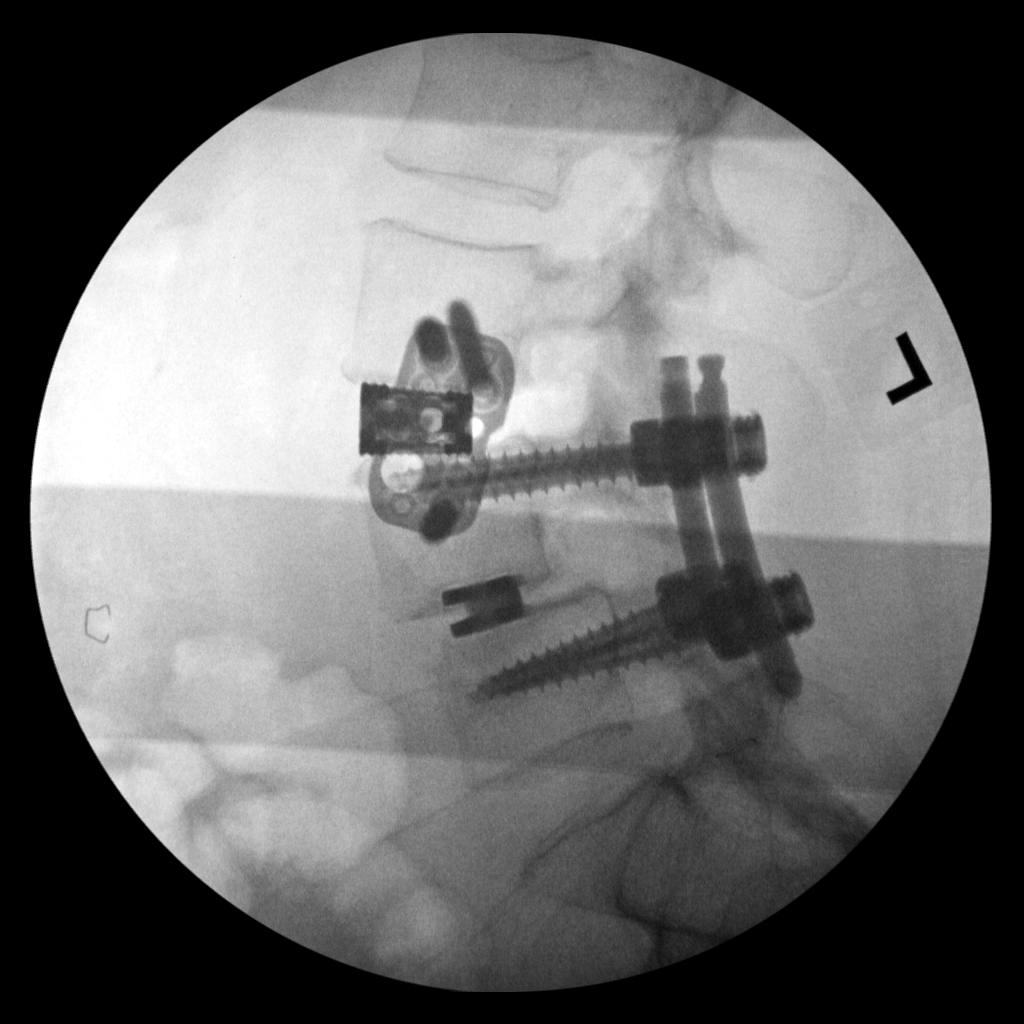

[2 of 2 positions shown; findings below may reference images not displayed]

FINDINGS: Intraoperative lumbar spine.

2 low resolution intraoperative spot views of the lumbar spine were
obtained. L4-L5 posterior fusion hardware is again noted. There is
stable anterolisthesis at this level. Additional hardware seen at
the L3-L4 level on the left.

Total fluoroscopy time: 2 minutes 9 seconds

Total radiation dose: 52.15 micro Gy
IMPRESSION: Intraoperative L3-L4 fusion.
